# Patient Record
Sex: Male | Born: 1935
Health system: Southern US, Community
[De-identification: ages and names within clinical notes are randomized; demographics above are authoritative.]

## PROBLEM LIST (undated history)

## (undated) DIAGNOSIS — M109 Gout, unspecified: Secondary | ICD-10-CM

## (undated) DIAGNOSIS — N400 Enlarged prostate without lower urinary tract symptoms: Secondary | ICD-10-CM

## (undated) DIAGNOSIS — C801 Malignant (primary) neoplasm, unspecified: Secondary | ICD-10-CM

## (undated) DIAGNOSIS — Z8619 Personal history of other infectious and parasitic diseases: Secondary | ICD-10-CM

## (undated) DIAGNOSIS — I10 Essential (primary) hypertension: Secondary | ICD-10-CM

## (undated) DIAGNOSIS — E785 Hyperlipidemia, unspecified: Secondary | ICD-10-CM

## (undated) HISTORY — PX: LEG / ANKLE SOFT TISSUE BIOPSY: SUR148

## (undated) HISTORY — DX: Personal history of other infectious and parasitic diseases: Z86.19

## (undated) HISTORY — DX: Hyperlipidemia, unspecified: E78.5

---

## 1998-03-29 DIAGNOSIS — M109 Gout, unspecified: Secondary | ICD-10-CM | POA: Insufficient documentation

## 2001-03-29 DIAGNOSIS — C801 Malignant (primary) neoplasm, unspecified: Secondary | ICD-10-CM

## 2001-03-29 DIAGNOSIS — Z8546 Personal history of malignant neoplasm of prostate: Secondary | ICD-10-CM | POA: Insufficient documentation

## 2001-03-29 HISTORY — DX: Malignant (primary) neoplasm, unspecified: C80.1

## 2001-08-07 DIAGNOSIS — K5730 Diverticulosis of large intestine without perforation or abscess without bleeding: Secondary | ICD-10-CM | POA: Insufficient documentation

## 2002-02-26 HISTORY — PX: PROSTATE SURGERY: SHX751

## 2004-07-22 ENCOUNTER — Ambulatory Visit: Payer: Self-pay | Admitting: Radiation Oncology

## 2005-07-22 ENCOUNTER — Ambulatory Visit: Payer: Self-pay | Admitting: Radiation Oncology

## 2006-01-07 DIAGNOSIS — I10 Essential (primary) hypertension: Secondary | ICD-10-CM | POA: Insufficient documentation

## 2007-04-11 ENCOUNTER — Ambulatory Visit: Payer: Self-pay | Admitting: Gastroenterology

## 2007-04-11 HISTORY — PX: COLONOSCOPY: SHX174

## 2007-06-21 DIAGNOSIS — E881 Lipodystrophy, not elsewhere classified: Secondary | ICD-10-CM | POA: Insufficient documentation

## 2009-01-20 DIAGNOSIS — R7303 Prediabetes: Secondary | ICD-10-CM | POA: Insufficient documentation

## 2010-04-23 ENCOUNTER — Ambulatory Visit: Payer: Self-pay | Admitting: Orthopedic Surgery

## 2010-05-03 HISTORY — PX: SHOULDER ARTHROSCOPY W/ ROTATOR CUFF REPAIR: SHX2400

## 2010-05-22 ENCOUNTER — Ambulatory Visit: Payer: Self-pay | Admitting: Orthopedic Surgery

## 2010-06-01 ENCOUNTER — Ambulatory Visit: Payer: Self-pay | Admitting: Orthopedic Surgery

## 2010-06-07 ENCOUNTER — Observation Stay: Payer: Self-pay | Admitting: Internal Medicine

## 2011-07-26 IMAGING — CT CT CHEST W/ CM
1 series · 15 of 33 positions shown, 19 images · IV contrast (APPLIED)
Comparison: none

REASON FOR EXAM: dypsnea, post op
COMMENTS:

[Series 4: soft tissue · axial · 0.74mm/px · z∈[-340,-82]mm · 15 of 102 slices shown, 19 images]
[im 8/102  mediastinal]
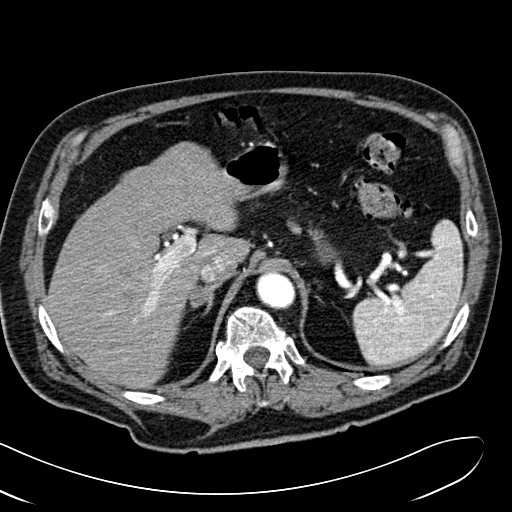
[im 8/102  lung]
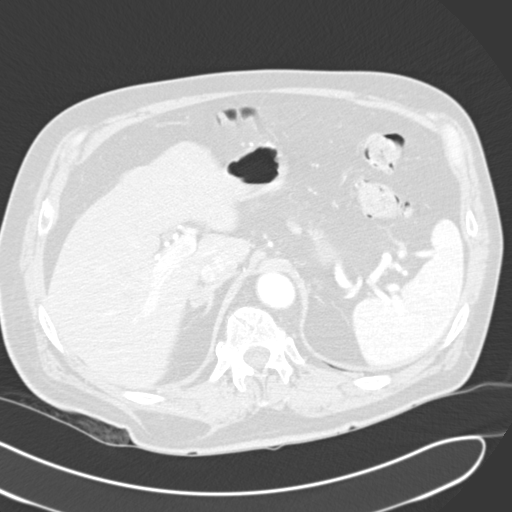
[im 15/102  lung]
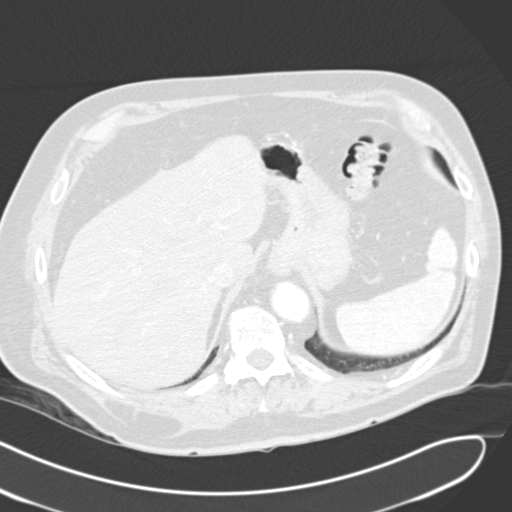
[im 21/102  lung]
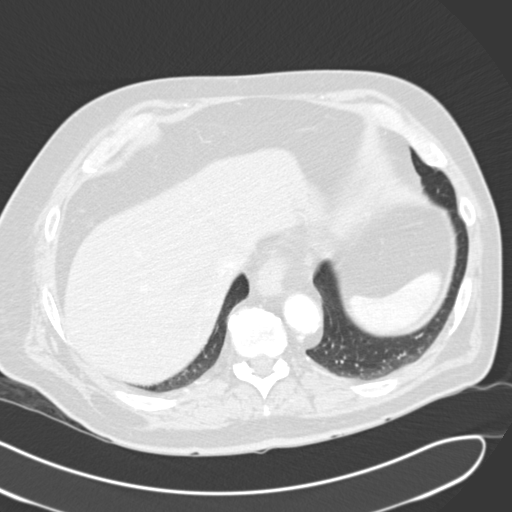
[im 27/102  lung]
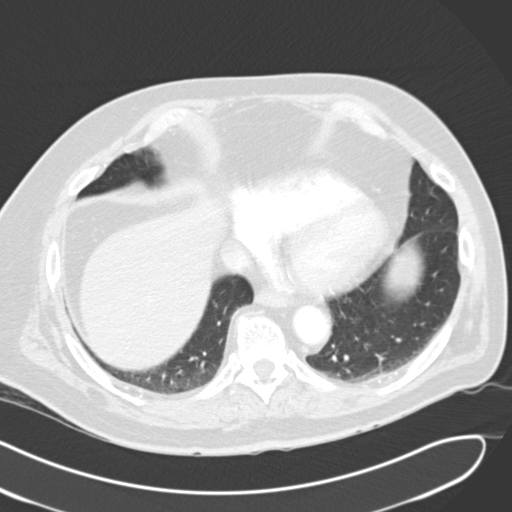
[im 34/102  mediastinal]
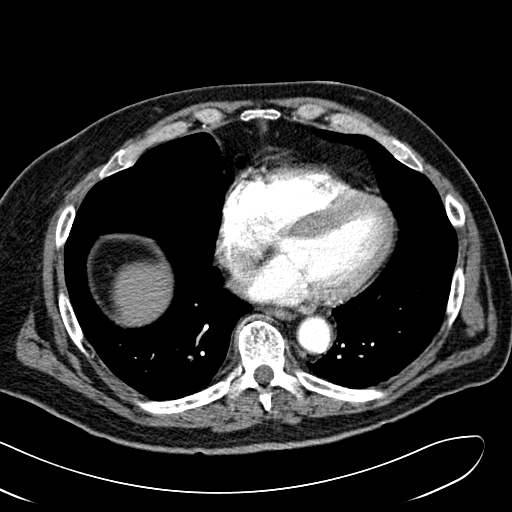
[im 34/102  lung]
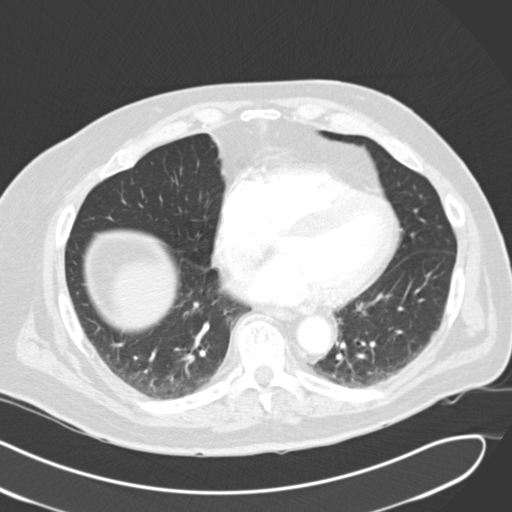
[im 41/102  lung]
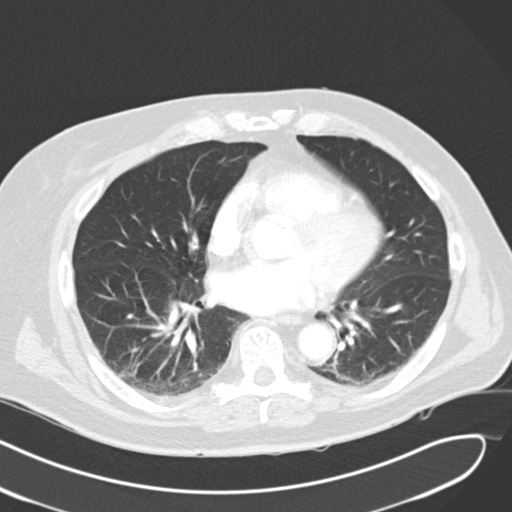
[im 45/102  lung]
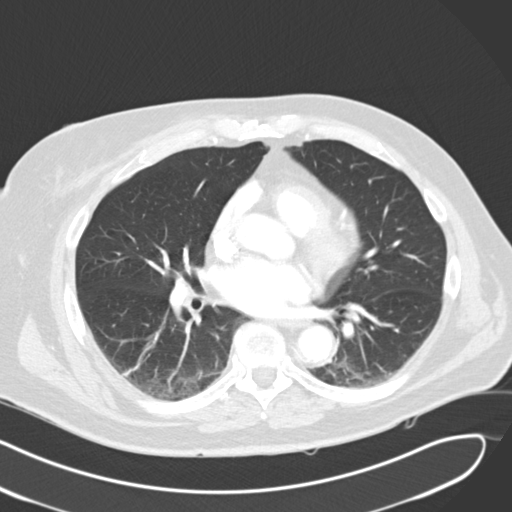
[im 53/102  lung]
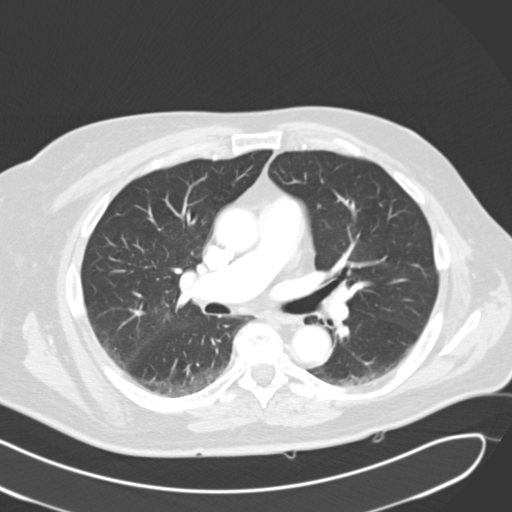
[im 57/102  mediastinal]
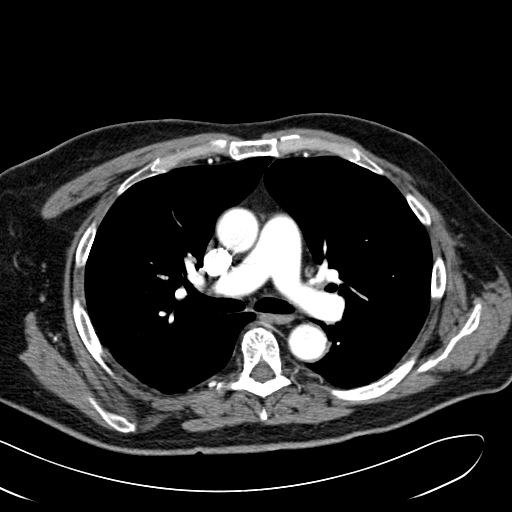
[im 57/102  lung]
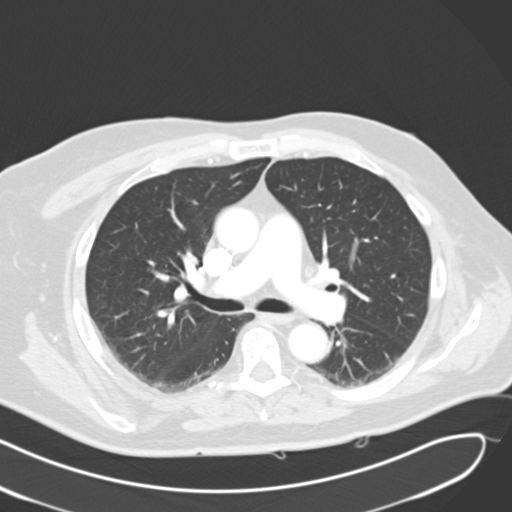
[im 61/102  lung]
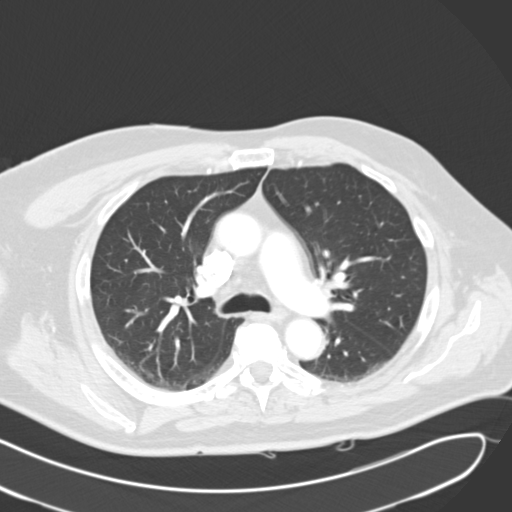
[im 68/102  lung]
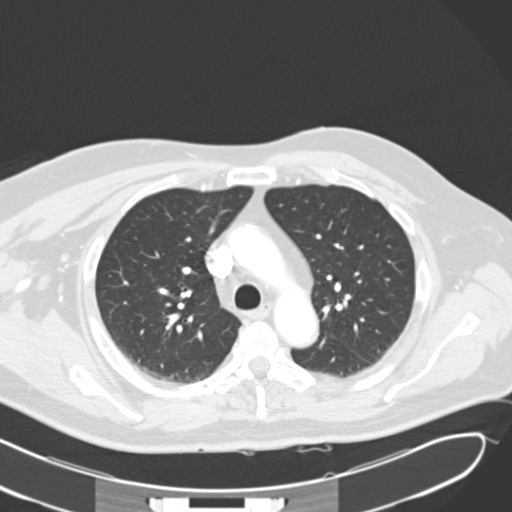
[im 75/102  lung]
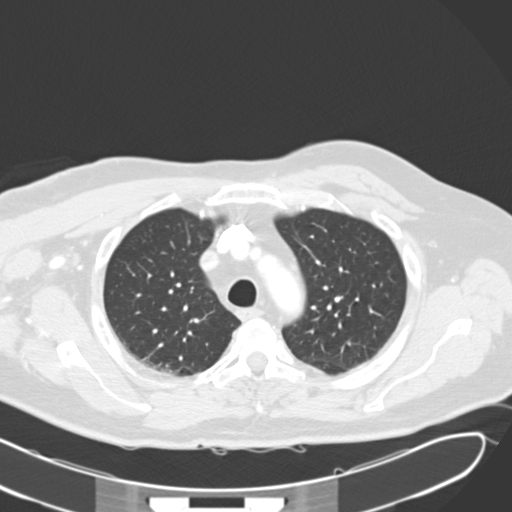
[im 81/102  mediastinal]
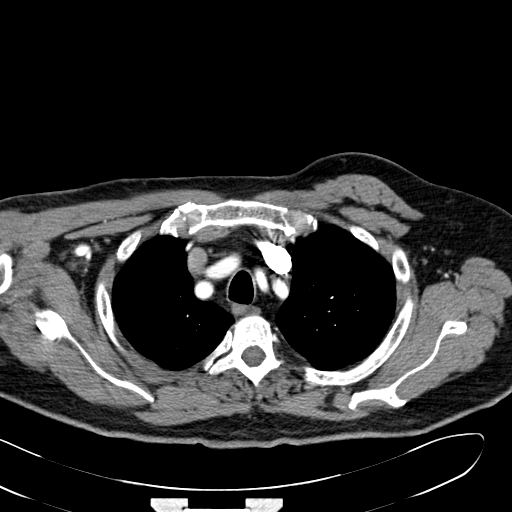
[im 81/102  lung]
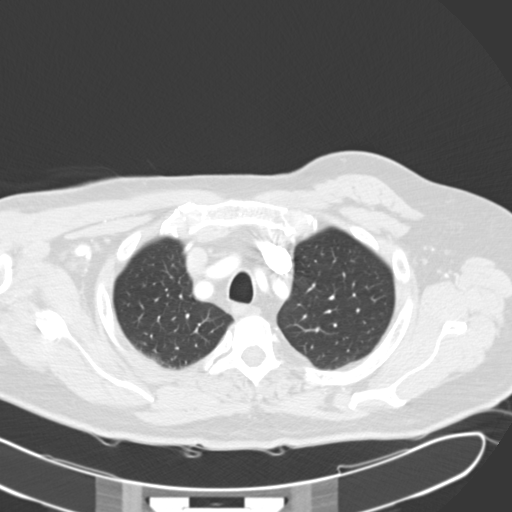
[im 87/102  lung]
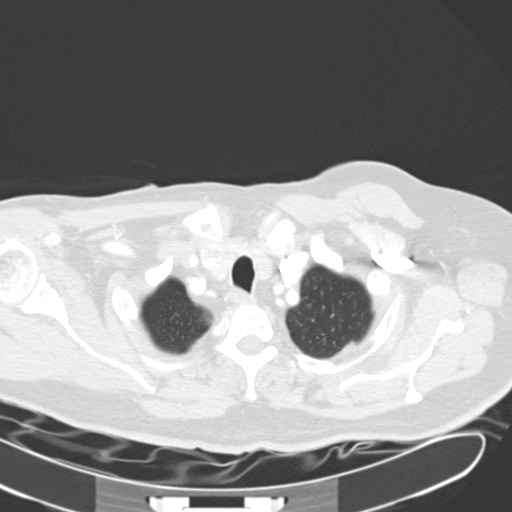
[im 94/102  lung]
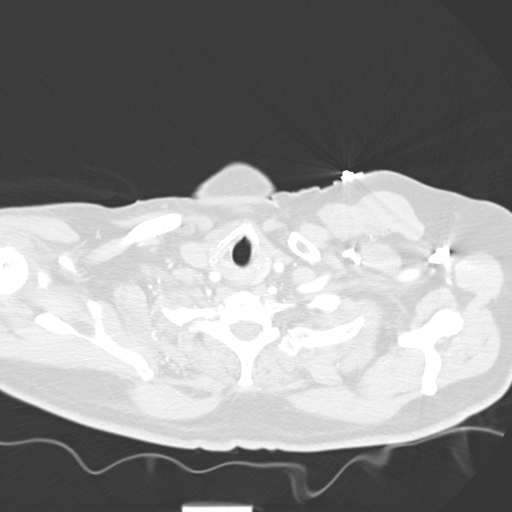

[15 of 33 positions shown; findings below may reference images not displayed]

PROCEDURE:     CT  - CT CHEST (FOR PE) W  - June 07, 2010  [DATE]

RESULT:     Axial CT scanning was performed through the chest at 3 mm
intervals and slice thicknesses following intravenous administration of 100
cc of [id number]. Review of multiplanar reconstructed images was performed
separately on the VIA monitor. Comparison is made to the chest x-ray 07 June, 2010.

The cardiac chambers are normal in size. The caliber of the thoracic aorta
is normal. There is no evidence of a false lumen. Contrast within the
pulmonary arterial tree is normal in appearance. I see no filling defects to
suggest an acute pulmonary embolism. I see no pathologic sized mediastinal
or hilar lymph nodes. There is no pleural nor pericardial effusion. The
retrosternal soft tissues are normal in appearance.

At lung window settings there is minimal compressive atelectasis posteriorly
in both lungs. Within the upper abdomen the observed portions of the liver
and spleen and gallbladder appear normal. There is mild enlargement of the
right adrenal gland. The lateral limb measures approximately 1.7 cm in
diameter.
IMPRESSION: 1. I do not see evidence of an acute pulmonary embolism.
2. I do not see evidence of CHF nor acute thoracic aortic pathology.
3. I see no acute pulmonary parenchymal abnormality.
4. There is mild enlargement of the right adrenal gland.

## 2011-09-16 IMAGING — CR DG CHEST 2V
1 series · 2 of 2 positions shown · non-contrast
Comparison: none

REASON FOR EXAM: near syncope
COMMENTS:   May transport without cardiac monitor

[Series 1: view not recorded · 0.17mm/px · 2 of 2 slices shown]
[im 1/2]
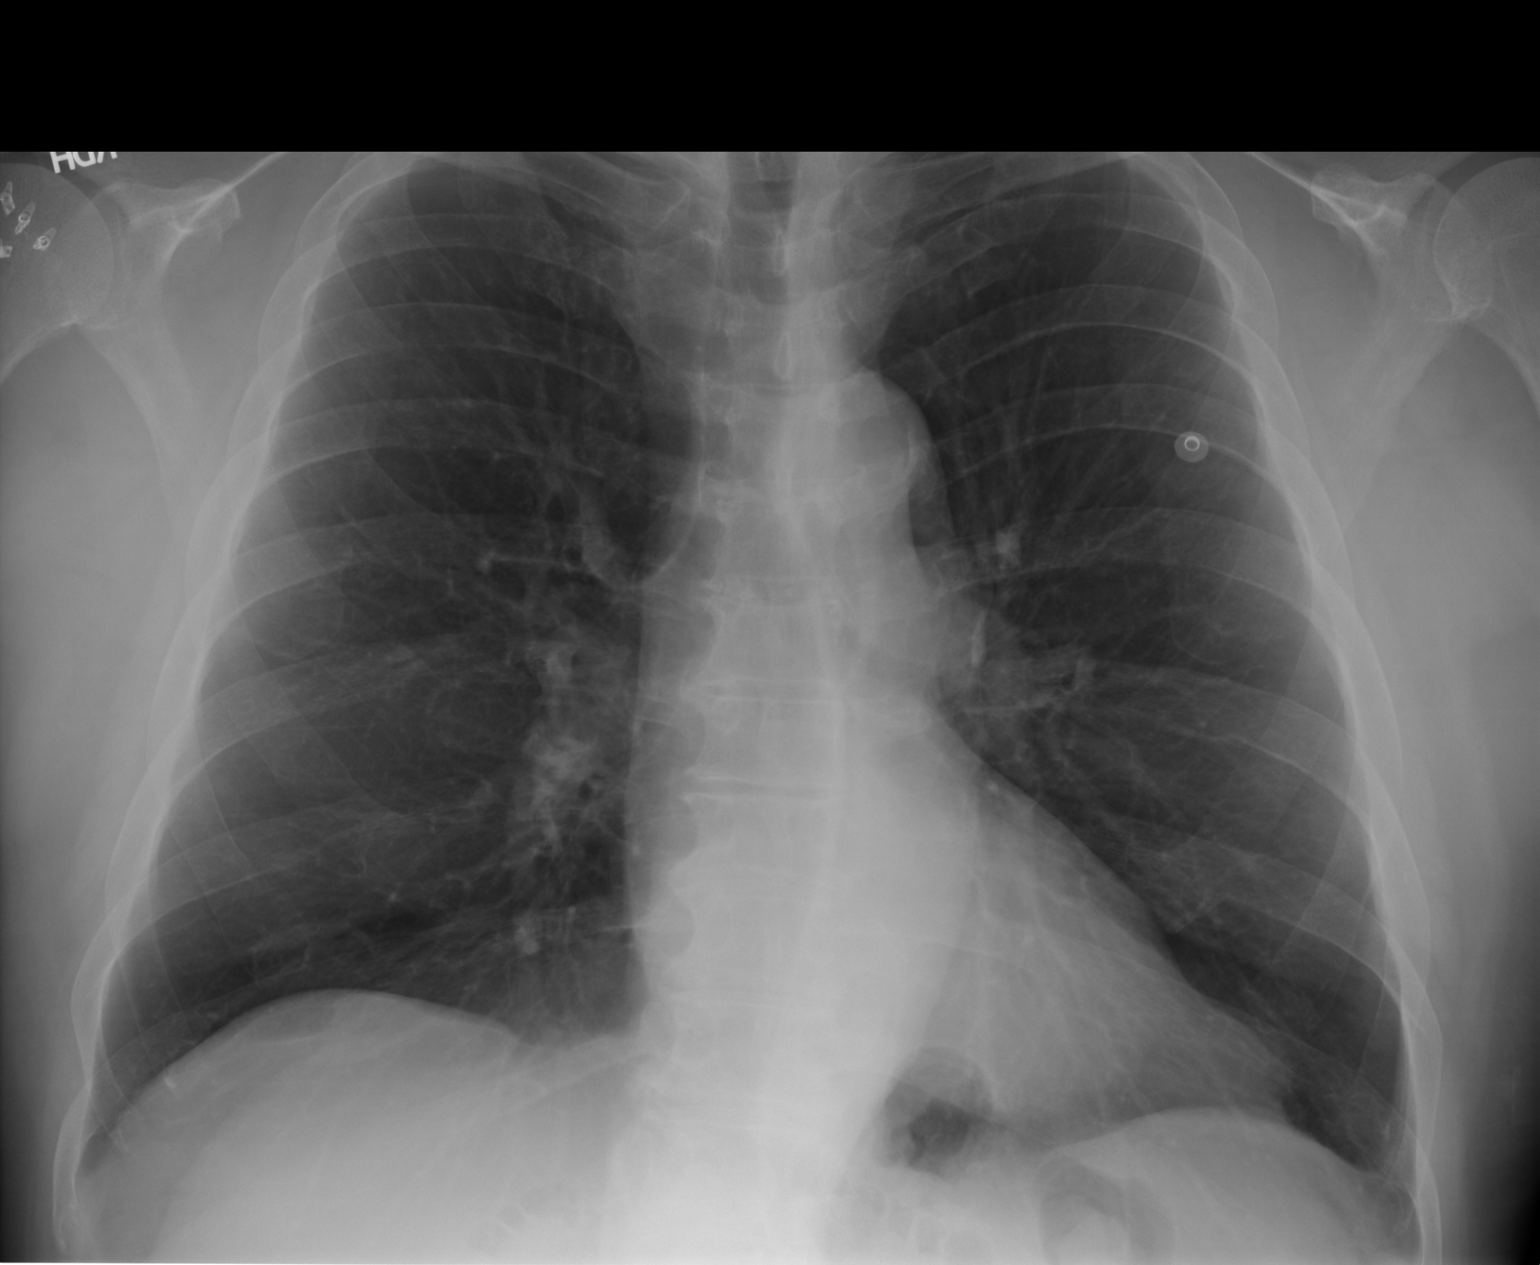
[im 2/2]
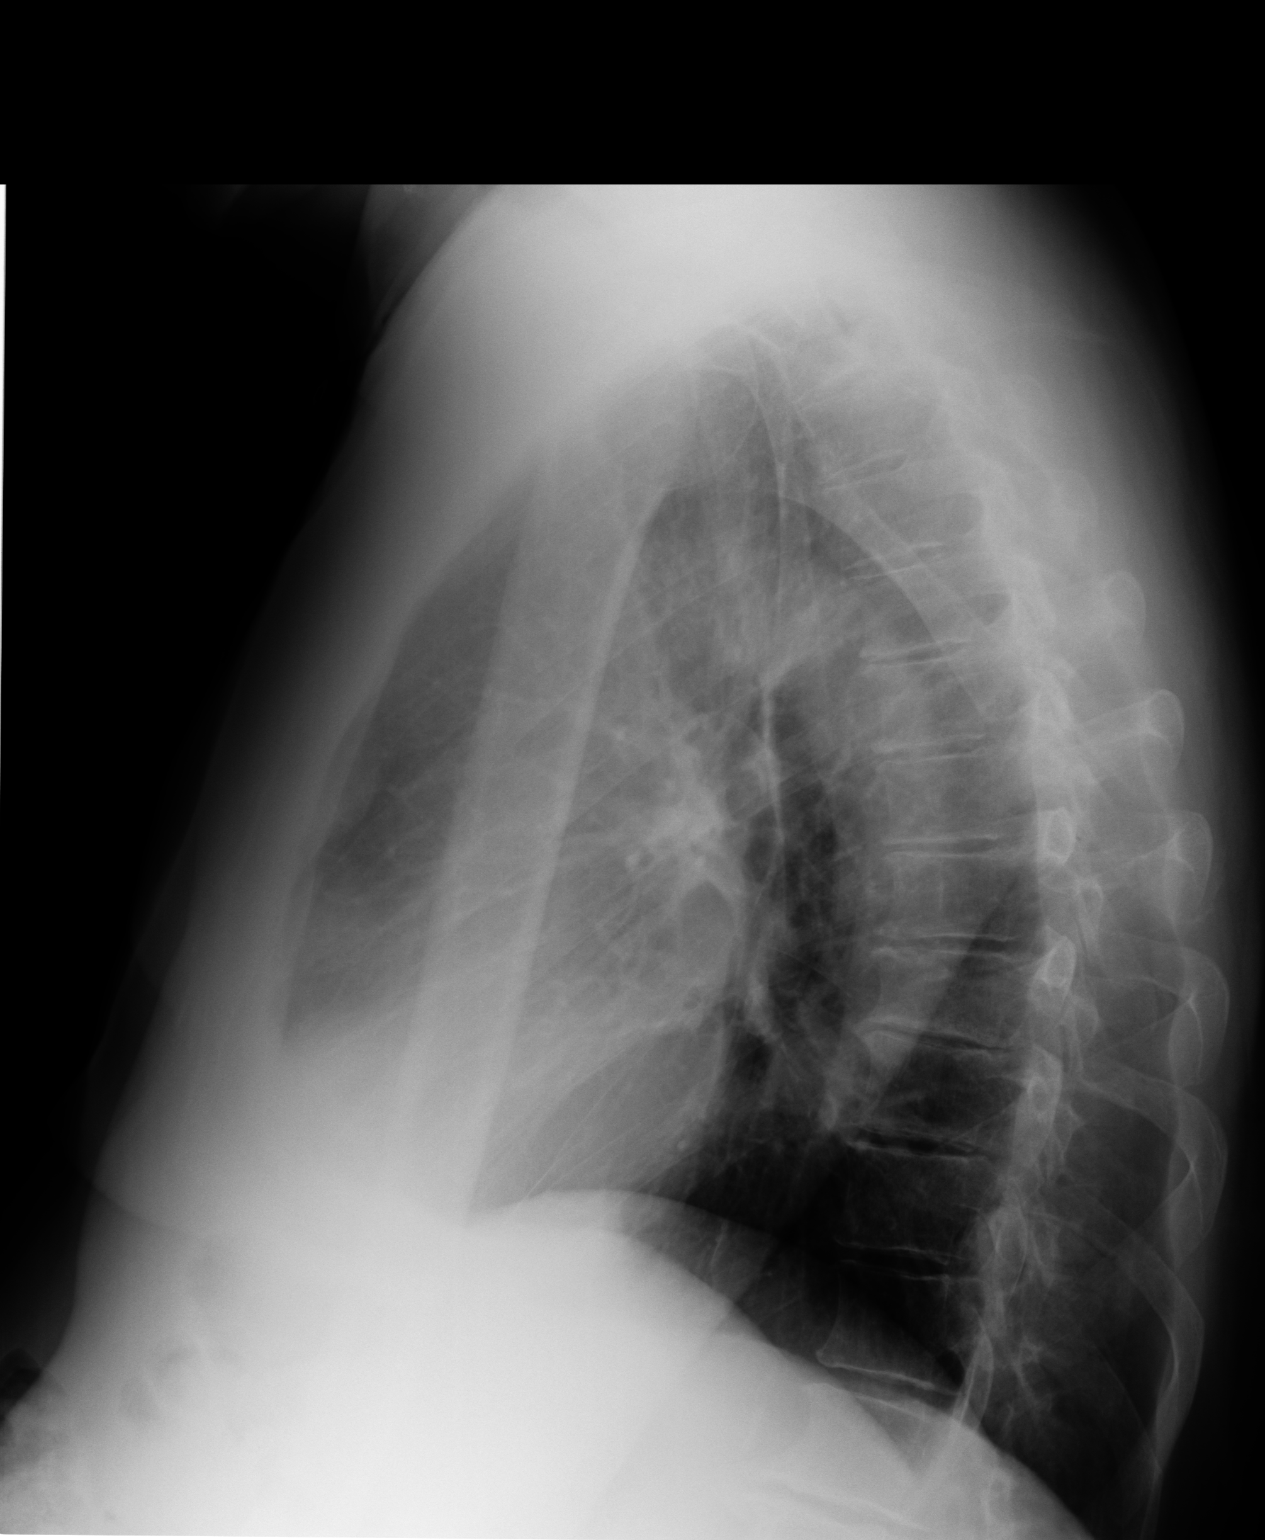

[2 of 2 positions shown; findings below may reference images not displayed]

PROCEDURE:     DXR - DXR CHEST PA (OR AP) AND LATERAL  - June 07, 2010  [DATE]

RESULT:     AP and lateral views of the chest reveal the lungs to be
well-expanded. I see no focal infiltrate. The cardiac silhouette is normal
in size. There is tortuosity of the descending thoracic aorta. The pulmonary
vascularity is not engorged. I see no pleural effusion. The bony thorax is
grossly normal.
IMPRESSION: I do not see evidence of acute cardiopulmonary abnormality.

## 2012-07-25 ENCOUNTER — Ambulatory Visit: Payer: Self-pay | Admitting: Gastroenterology

## 2012-07-26 LAB — PATHOLOGY REPORT

## 2014-04-26 LAB — PSA: PSA: 0.2

## 2014-04-26 LAB — BASIC METABOLIC PANEL
BUN: 13 mg/dL (ref 4–21)
CREATININE: 1 mg/dL (ref 0.6–1.3)
Glucose: 97 mg/dL
POTASSIUM: 4.6 mmol/L (ref 3.4–5.3)
SODIUM: 141 mmol/L (ref 137–147)

## 2014-12-09 ENCOUNTER — Encounter: Payer: Self-pay | Admitting: *Deleted

## 2014-12-16 ENCOUNTER — Ambulatory Visit: Payer: Medicare Other | Admitting: Anesthesiology

## 2014-12-16 ENCOUNTER — Ambulatory Visit
Admission: RE | Admit: 2014-12-16 | Discharge: 2014-12-16 | Disposition: A | Discharge status: Home / Self Care | Payer: Medicare Other | Source: Ambulatory Visit | Attending: Ophthalmology | Admitting: Ophthalmology

## 2014-12-16 ENCOUNTER — Encounter: Payer: Self-pay | Admitting: *Deleted

## 2014-12-16 ENCOUNTER — Encounter
Admission: RE | Disposition: A | Discharge status: Home / Self Care | Payer: Self-pay | Source: Ambulatory Visit | Attending: Ophthalmology

## 2014-12-16 DIAGNOSIS — N400 Enlarged prostate without lower urinary tract symptoms: Secondary | ICD-10-CM | POA: Diagnosis not present

## 2014-12-16 DIAGNOSIS — Z79899 Other long term (current) drug therapy: Secondary | ICD-10-CM | POA: Diagnosis not present

## 2014-12-16 DIAGNOSIS — Z7982 Long term (current) use of aspirin: Secondary | ICD-10-CM | POA: Insufficient documentation

## 2014-12-16 DIAGNOSIS — H269 Unspecified cataract: Secondary | ICD-10-CM | POA: Insufficient documentation

## 2014-12-16 DIAGNOSIS — I10 Essential (primary) hypertension: Secondary | ICD-10-CM | POA: Diagnosis not present

## 2014-12-16 DIAGNOSIS — E780 Pure hypercholesterolemia: Secondary | ICD-10-CM | POA: Diagnosis not present

## 2014-12-16 DIAGNOSIS — H2511 Age-related nuclear cataract, right eye: Secondary | ICD-10-CM | POA: Diagnosis not present

## 2014-12-16 DIAGNOSIS — Z8546 Personal history of malignant neoplasm of prostate: Secondary | ICD-10-CM | POA: Insufficient documentation

## 2014-12-16 HISTORY — DX: Benign prostatic hyperplasia without lower urinary tract symptoms: N40.0

## 2014-12-16 HISTORY — DX: Malignant (primary) neoplasm, unspecified: C80.1

## 2014-12-16 HISTORY — PX: CATARACT EXTRACTION W/PHACO: SHX586

## 2014-12-16 HISTORY — DX: Gout, unspecified: M10.9

## 2014-12-16 HISTORY — DX: Essential (primary) hypertension: I10

## 2014-12-16 SURGERY — PHACOEMULSIFICATION, CATARACT, WITH IOL INSERTION
Anesthesia: Monitor Anesthesia Care | Laterality: Right

## 2014-12-16 MED ORDER — ALFENTANIL 500 MCG/ML IJ INJ
INJECTION | INTRAMUSCULAR | Status: DC | PRN
  Administered 2014-12-16: 500 ug via INTRAVENOUS

## 2014-12-16 MED ORDER — BUPIVACAINE HCL (PF) 0.75 % IJ SOLN
INTRAMUSCULAR | Status: AC
  Filled 2014-12-16: qty 10

## 2014-12-16 MED ORDER — LIDOCAINE HCL (PF) 4 % IJ SOLN
INTRAMUSCULAR | Status: AC
  Filled 2014-12-16: qty 5

## 2014-12-16 MED ORDER — CEFUROXIME OPHTHALMIC INJECTION 1 MG/0.1 ML
INJECTION | OPHTHALMIC | Status: DC | PRN
  Administered 2014-12-16: 0.1 mL via INTRACAMERAL

## 2014-12-16 MED ORDER — TETRACAINE HCL 0.5 % OP SOLN
OPHTHALMIC | Status: AC
  Filled 2014-12-16: qty 2

## 2014-12-16 MED ORDER — MOXIFLOXACIN HCL 0.5 % OP SOLN
OPHTHALMIC | Status: AC
  Administered 2014-12-16: 1 [drp] via OPHTHALMIC
  Filled 2014-12-16: qty 3

## 2014-12-16 MED ORDER — EPINEPHRINE HCL 1 MG/ML IJ SOLN
INTRAMUSCULAR | Status: AC
  Filled 2014-12-16: qty 2

## 2014-12-16 MED ORDER — EPINEPHRINE HCL 1 MG/ML IJ SOLN
INTRAOCULAR | Status: DC | PRN
  Administered 2014-12-16: 250 mL via OPHTHALMIC

## 2014-12-16 MED ORDER — CYCLOPENTOLATE HCL 2 % OP SOLN
1.0000 [drp] | OPHTHALMIC | Status: AC | PRN
  Administered 2014-12-16 (×4): 1 [drp] via OPHTHALMIC

## 2014-12-16 MED ORDER — LIDOCAINE HCL (PF) 4 % IJ SOLN
INTRAMUSCULAR | Status: DC | PRN
  Administered 2014-12-16: 5 mL via OPHTHALMIC

## 2014-12-16 MED ORDER — CYCLOPENTOLATE HCL 2 % OP SOLN
OPHTHALMIC | Status: AC
  Administered 2014-12-16: 1 [drp] via OPHTHALMIC
  Filled 2014-12-16: qty 2

## 2014-12-16 MED ORDER — MIDAZOLAM HCL 2 MG/2ML IJ SOLN
INTRAMUSCULAR | Status: DC | PRN
  Administered 2014-12-16: 0.5 mg via INTRAVENOUS

## 2014-12-16 MED ORDER — CEFUROXIME OPHTHALMIC INJECTION 1 MG/0.1 ML
INJECTION | OPHTHALMIC | Status: AC
  Filled 2014-12-16: qty 0.1

## 2014-12-16 MED ORDER — SODIUM CHLORIDE 0.9 % IV SOLN
INTRAVENOUS | Status: DC
  Administered 2014-12-16: 08:00:00 via INTRAVENOUS
  Administered 2014-12-16: 50 mL/h via INTRAVENOUS

## 2014-12-16 MED ORDER — BSS IO SOLN
INTRAOCULAR | Status: DC | PRN
  Administered 2014-12-16: .5 mL via OPHTHALMIC

## 2014-12-16 MED ORDER — TETRACAINE HCL 0.5 % OP SOLN
OPHTHALMIC | Status: DC | PRN
  Administered 2014-12-16: 2 [drp] via OPHTHALMIC

## 2014-12-16 MED ORDER — MOXIFLOXACIN HCL 0.5 % OP SOLN
1.0000 [drp] | OPHTHALMIC | Status: AC | PRN
  Administered 2014-12-16 (×3): 1 [drp] via OPHTHALMIC

## 2014-12-16 MED ORDER — NA CHONDROIT SULF-NA HYALURON 40-17 MG/ML IO SOLN
INTRAOCULAR | Status: AC
  Filled 2014-12-16: qty 1

## 2014-12-16 MED ORDER — CARBACHOL 0.01 % IO SOLN
INTRAOCULAR | Status: DC | PRN
  Administered 2014-12-16: .1 mL via INTRAOCULAR

## 2014-12-16 MED ORDER — ATROPINE SULFATE 1 % OP SOLN
OPHTHALMIC | Status: AC
  Filled 2014-12-16: qty 5

## 2014-12-16 MED ORDER — PHENYLEPHRINE HCL 10 % OP SOLN
OPHTHALMIC | Status: AC
  Administered 2014-12-16: 1 [drp] via OPHTHALMIC
  Filled 2014-12-16: qty 5

## 2014-12-16 MED ORDER — HYALURONIDASE HUMAN 150 UNIT/ML IJ SOLN
INTRAMUSCULAR | Status: AC
  Filled 2014-12-16: qty 1

## 2014-12-16 MED ORDER — NA CHONDROIT SULF-NA HYALURON 40-17 MG/ML IO SOLN
INTRAOCULAR | Status: DC | PRN
  Administered 2014-12-16: 1 mL via INTRAOCULAR

## 2014-12-16 MED ORDER — PHENYLEPHRINE HCL 10 % OP SOLN
1.0000 [drp] | OPHTHALMIC | Status: AC | PRN
  Administered 2014-12-16 (×4): 1 [drp] via OPHTHALMIC

## 2014-12-16 SURGICAL SUPPLY — 33 items
CANNULA ANT/CHMB 27GA (MISCELLANEOUS) ×2 IMPLANT
CORD BIP STRL DISP 12FT (MISCELLANEOUS) ×2 IMPLANT
CUP MEDICINE 2OZ PLAST GRAD ST (MISCELLANEOUS) ×2 IMPLANT
DRAPE XRAY CASSETTE 23X24 (DRAPES) ×2 IMPLANT
ERASER HMR WETFIELD 18G (MISCELLANEOUS) ×2 IMPLANT
GLOVE BIO SURGEON STRL SZ8 (GLOVE) ×2 IMPLANT
GLOVE SURG LX 6.5 MICRO (GLOVE) ×1
GLOVE SURG LX 8.0 MICRO (GLOVE) ×1
GLOVE SURG LX STRL 6.5 MICRO (GLOVE) ×1 IMPLANT
GLOVE SURG LX STRL 8.0 MICRO (GLOVE) ×1 IMPLANT
GOWN STRL REUS W/ TWL LRG LVL3 (GOWN DISPOSABLE) ×1 IMPLANT
GOWN STRL REUS W/ TWL XL LVL3 (GOWN DISPOSABLE) ×1 IMPLANT
GOWN STRL REUS W/TWL LRG LVL3 (GOWN DISPOSABLE) ×1
GOWN STRL REUS W/TWL XL LVL3 (GOWN DISPOSABLE) ×1
LENS IOL ACRSF IQ ULTRA 15.0 (Intraocular Lens) ×1 IMPLANT
LENS IOL ACRYSOF IQ 15.0 (Intraocular Lens) ×2 IMPLANT
PACK CATARACT (MISCELLANEOUS) ×2 IMPLANT
PACK CATARACT DINGLEDEIN LX (MISCELLANEOUS) ×2 IMPLANT
PACK EYE AFTER SURG (MISCELLANEOUS) ×2 IMPLANT
SHLD EYE VISITEC  UNIV (MISCELLANEOUS) ×2 IMPLANT
SIZER BREAST SGL USE 650CC (SIZER) IMPLANT
SOL BAL SALT 15ML (MISCELLANEOUS) ×2
SOL BSS BAG (MISCELLANEOUS) ×2
SOL PREP PVP 2OZ (MISCELLANEOUS) ×2
SOLUTION BAL SALT 15ML (MISCELLANEOUS) ×1 IMPLANT
SOLUTION BSS BAG (MISCELLANEOUS) ×1 IMPLANT
SOLUTION PREP PVP 2OZ (MISCELLANEOUS) ×1 IMPLANT
SUT SILK 5-0 (SUTURE) ×2 IMPLANT
SYR 3ML LL SCALE MARK (SYRINGE) ×2 IMPLANT
SYR 5ML LL (SYRINGE) ×4 IMPLANT
SYR TB 1ML 27GX1/2 LL (SYRINGE) ×2 IMPLANT
WATER STERILE IRR 1000ML POUR (IV SOLUTION) ×2 IMPLANT
WIPE NON LINTING 3.25X3.25 (MISCELLANEOUS) ×2 IMPLANT

## 2014-12-16 NOTE — Interval H&P Note (Signed)
History and Physical Interval Note:  12/16/2014 7:22 AM  Mark Hunter  has presented today for surgery, with the diagnosis of CATARACT  The various methods of treatment have been discussed with the patient and family. After consideration of risks, benefits and other options for treatment, the patient has consented to  Procedure(s): CATARACT EXTRACTION PHACO AND INTRAOCULAR LENS PLACEMENT ([place]) (Right) as a surgical intervention .  The patient's history has been reviewed, patient examined, no change in status, stable for surgery.  I have reviewed the patient's chart and labs.  Questions were answered to the patient's satisfaction.     DINGELDEIN,STEVEN

## 2014-12-16 NOTE — Op Note (Signed)
Date of Surgery: 12/16/2014 Date of Dictation: 12/16/2014 8:11 AM Pre-operative Diagnosis:  Nuclear Sclerotic Cataract and Cortical Cataract right Eye Post-operative Diagnosis: same Procedure performed: Extra-capsular Cataract Extraction (ECCE) with placement of a posterior chamber intraocular lens (IOL) right Eye IOL:  Implant Name Type Inv. Item Serial No. Manufacturer Lot No. LRB No. Used  LENS IOL ACRYSOF IQ 15.0 - [id number] Intraocular Lens LENS IOL ACRYSOF IQ 15.0 [id number] ALCON   Right 1   Anesthesia: 2% Lidocaine and 4% Marcaine in a 50/50 mixture with 10 unites/ml of Hylenex given as a peribulbar Anesthesiologist: Anesthesiologist: Gunnar Fusi, MD CRNA: Jonna Clark, CRNA Complications: none Estimated Blood Loss: less than 1 ml  Description of procedure:  The patient was given anesthesia and sedation via intravenous access. The patient was then prepped and draped in the usual fashion. A 25-gauge needle was bent for initiating the capsulorhexis. A 5-0 silk suture was placed through the conjunctiva superior and inferiorly to serve as bridle sutures. Hemostasis was obtained at the superior limbus using an eraser cautery. A partial thickness groove was made at the anterior surgical limbus with a 64 Beaver blade and this was dissected anteriorly with an Avaya. The anterior chamber was entered at 10 o'clock with a 1.0 mm paracentesis knife and through the lamellar dissection with a 2.6 mm Alcon keratome. Epi-Shugarcaine 0.5 CC [9 cc BSS Plus (Alcon), 3 cc 4% preservative-free lidocaine (Hospira) and 4 cc 1:1000 preservative-free, bisulfite-free epinephrine] was injected into the anterior chamber via the paracentesis tract. Epi-Shugarcaine 0.5 CC [9 cc BSS Plus (Alcon), 3 cc 4% preservative-free lidocaine (Hospira) and 4 cc 1:1000 preservative-free, bisulfite-free epinephrine] was injected into the anterior chamber via the paracentesis tract. DiscoVisc was injected to  replace the aqueous and a continuous tear curvilinear capsulorhexis was performed using a bent 25-gauge needle.  Balance salt on a syringe was used to perform hydro-dissection and phacoemulsification was carried out using a divide and conquer technique. Procedure(s) with comments: CATARACT EXTRACTION PHACO AND INTRAOCULAR LENS PLACEMENT ([facility]) (Right) - [place]   1:12 ap    20.2 cde   25.43 cassette lot # [id number]. Irrigation/aspiration was used to remove the residual cortex. All the cortex came out with the phacoemulsification so none was left after the angles of the bag were checked. The capsular bag was inflated with DiscoVisc. The intraocular lens was inserted into the capsular bag using a pre-loaded UltraSert Delivery System. Irrigation/aspiration was used to remove the residual DiscoVisc. The wound was inflated with balanced salt and checked for leaks. None were found. Miostat was injected via the paracentesis track and 0.1 ml of cefuroxime containing 1 mg of drug  was injected via the paracentesis track. The wound was checked for leaks again and none were found.   The bridal sutures were removed and two drops of Vigamox were placed on the eye. An eye shield was placed to protect the eye and the patient was discharged to the recovery area in good condition.   DINGELDEIN,STEVEN MD

## 2014-12-16 NOTE — Transfer of Care (Signed)
Immediate Anesthesia Transfer of Care Note  Patient: Mark Hunter  Procedure(s) Performed: Procedure(s) with comments: CATARACT EXTRACTION PHACO AND INTRAOCULAR LENS PLACEMENT ([facility]) (Right) - [place]   1:12 ap    20.2 cde   25.43 cassette lot # [id number]  Patient Location: PACU  Anesthesia Type:MAC  Level of Consciousness: awake, alert  and oriented  Airway & Oxygen Therapy: Patient Spontanous Breathing  Post-op Assessment: Report given to RN and Post -op Vital signs reviewed and stable  Post vital signs: Reviewed and stable  Last Vitals:  Filed Vitals:   12/16/14 0813  BP: 147/55  Pulse: 50  Temp: 36.3 C  Resp: 16    Complications: No apparent anesthesia complications

## 2014-12-16 NOTE — Anesthesia Postprocedure Evaluation (Signed)
  Anesthesia Post-op Note  Patient: Mark Hunter  Procedure(s) Performed: Procedure(s) with comments: CATARACT EXTRACTION PHACO AND INTRAOCULAR LENS PLACEMENT ([facility]) (Right) - [place]   1:12 ap    20.2 cde   25.43 cassette lot # [id number]  Anesthesia type:MAC  Patient location: PACU  Post pain: Pain level controlled  Post assessment: Post-op Vital signs reviewed, Patient's Cardiovascular Status Stable, Respiratory Function Stable, Patent Airway and No signs of Nausea or vomiting  Post vital signs: Reviewed and stable  Last Vitals:  Filed Vitals:   12/16/14 0813  BP: 147/55  Pulse: 50  Temp: 36.3 C  Resp: 16    Level of consciousness: awake, alert  and patient cooperative  Complications: No apparent anesthesia complications

## 2014-12-16 NOTE — H&P (Signed)
See scanned note.

## 2014-12-16 NOTE — Addendum Note (Signed)
Addendum  created 12/16/14 7207 by Gunnar Fusi, MD   Modules edited: Anesthesia Attestations

## 2014-12-16 NOTE — Discharge Instructions (Addendum)
See handout. Eye Surgery Discharge Instructions  Expect mild scratchy sensation or mild soreness. DO NOT RUB YOUR EYE!  The day of surgery:  Minimal physical activity, but bed rest is not required  No reading, computer work, or close hand work  No bending, lifting, or straining.  May watch TV  For 24 hours:  No driving, legal decisions, or alcoholic beverages  Safety precautions  Eat anything you prefer: It is better to start with liquids, then soup then solid foods.  _____ Eye patch should be worn until postoperative exam tomorrow.  ____ Solar shield eyeglasses should be worn for comfort in the sunlight/patch while sleeping  Resume all regular medications including aspirin or Coumadin if these were discontinued prior to surgery. You may shower, bathe, shave, or wash your hair. Tylenol may be taken for mild discomfort.  Call your doctor if you experience significant pain, nausea, or vomiting, fever > 101 or other signs of infection. [phone] or [phone] Specific instructions:  Follow-up Information    Follow up with Estill Cotta, MD.   Specialty:  Ophthalmology   Why:  12-16-14 at 10:30   Contact information:   [place] [id number] [phone]      Eye Surgery Discharge Instructions  Expect mild scratchy sensation or mild soreness. DO NOT RUB YOUR EYE!  The day of surgery:  Minimal physical activity, but bed rest is not required  No reading, computer work, or close hand work  No bending, lifting, or straining.  May watch TV  For 24 hours:  No driving, legal decisions, or alcoholic beverages  Safety precautions  Eat anything you prefer: It is better to start with liquids, then soup then solid foods.  _____ Eye patch should be worn until postoperative exam tomorrow.  ____ Solar shield eyeglasses should be worn for comfort in the sunlight/patch while sleeping  Resume all regular medications including aspirin or  Coumadin if these were discontinued prior to surgery. You may shower, bathe, shave, or wash your hair. Tylenol may be taken for mild discomfort.  Call your doctor if you experience significant pain, nausea, or vomiting, fever > 101 or other signs of infection. [phone] or [phone] Specific instructions:  Follow-up Information    Follow up with Estill Cotta, MD.   Specialty:  Ophthalmology   Why:  12-16-14 at 10:30   Contact information:   [address]   [place] [address] [phone]

## 2014-12-16 NOTE — Anesthesia Preprocedure Evaluation (Addendum)
Anesthesia Evaluation  Patient identified by MRN, date of birth, ID band Patient awake    Reviewed: Allergy & Precautions, NPO status , Patient's Chart, lab work & pertinent test results  History of Anesthesia Complications Negative for: history of anesthetic complications  Airway Mallampati: II  TM Distance: >3 FB Neck ROM: Full    Dental  (+) Teeth Intact   Pulmonary neg pulmonary ROS,           Cardiovascular hypertension, Pt. on medications and Pt. on home beta blockers      Neuro/Psych negative neurological ROS     GI/Hepatic negative GI ROS, Neg liver ROS,   Endo/Other  negative endocrine ROS  Renal/GU negative Renal ROS     Musculoskeletal   Abdominal   Peds  Hematology negative hematology ROS (+)   Anesthesia Other Findings   Reproductive/Obstetrics                            Anesthesia Physical Anesthesia Plan  ASA: II  Anesthesia Plan: MAC   Post-op Pain Management:    Induction: Intravenous  Airway Management Planned: Nasal Cannula  Additional Equipment:   Intra-op Plan:   Post-operative Plan:   Informed Consent: I have reviewed the patients History and Physical, chart, labs and discussed the procedure including the risks, benefits and alternatives for the proposed anesthesia with the patient or authorized representative who has indicated his/her understanding and acceptance.     Plan Discussed with:   Anesthesia Plan Comments:         Anesthesia Quick Evaluation

## 2014-12-17 ENCOUNTER — Encounter: Payer: Self-pay | Admitting: Ophthalmology

## 2015-07-09 DIAGNOSIS — M545 Low back pain, unspecified: Secondary | ICD-10-CM | POA: Insufficient documentation

## 2015-07-09 DIAGNOSIS — M25519 Pain in unspecified shoulder: Secondary | ICD-10-CM | POA: Insufficient documentation

## 2015-07-16 ENCOUNTER — Encounter: Payer: Self-pay | Admitting: Family Medicine

## 2015-07-17 ENCOUNTER — Encounter: Payer: Self-pay | Admitting: Family Medicine

## 2015-07-17 ENCOUNTER — Ambulatory Visit (INDEPENDENT_AMBULATORY_CARE_PROVIDER_SITE_OTHER): Payer: Medicare HMO | Admitting: Family Medicine

## 2015-07-17 VITALS — BP 140/58 | HR 64 | Temp 98.5°F | Resp 16 | Ht 68.0 in | Wt 203.0 lb

## 2015-07-17 DIAGNOSIS — Z23 Encounter for immunization: Secondary | ICD-10-CM

## 2015-07-17 DIAGNOSIS — Z125 Encounter for screening for malignant neoplasm of prostate: Secondary | ICD-10-CM | POA: Diagnosis not present

## 2015-07-17 DIAGNOSIS — Z0000 Encounter for general adult medical examination without abnormal findings: Secondary | ICD-10-CM

## 2015-07-17 DIAGNOSIS — L989 Disorder of the skin and subcutaneous tissue, unspecified: Secondary | ICD-10-CM | POA: Diagnosis not present

## 2015-07-17 DIAGNOSIS — Z8546 Personal history of malignant neoplasm of prostate: Secondary | ICD-10-CM | POA: Diagnosis not present

## 2015-07-17 DIAGNOSIS — I10 Essential (primary) hypertension: Secondary | ICD-10-CM | POA: Diagnosis not present

## 2015-07-17 NOTE — Patient Instructions (Signed)
It is recommended to engage in 150 minutes of vigorous exercise every week.

## 2015-07-17 NOTE — Progress Notes (Signed)
Patient: Mark Hunter, Male    DOB: [birth year], 82 y.o.   MRN: [id number] Visit Date: 07/17/2015  Today's Provider: Lelon Huh, MD   Chief Complaint  Patient presents with  . Annual Exam  . Hypertension  . Gout   Subjective:    Annual physical  Mark Hunter is a 82 y.o. male. He feels well. He reports exercising yes/light walking. He reports he is sleeping fairly well.  -----------------------------------------------------------   Follow-up for gouty arthropathy from 04/23/2014; no changes were made, continue current medications.    Hypertension, follow-up:  BP Readings from Last 3 Encounters:  07/17/15 140/58  12/16/14 149/52    He was last seen for hypertension 04/23/2014.  BP at that visit was 140/56. Management since that visit includes; no changes were made, continue current medications.He reports good compliance with treatment. He is not having side effects. none  He is exercising. He is adherent to low salt diet.   Outside blood pressures are 140/60. He is experiencing none.  Patient denies none.   Cardiovascular risk factors include none.  Use of agents associated with hypertension: none.   ----------------------------------------------------------------------     Review of Systems  Constitutional: Negative for fever, chills, appetite change and fatigue.  HENT: Negative for congestion, ear pain, hearing loss, nosebleeds and trouble swallowing.   Eyes: Negative for pain and visual disturbance.  Respiratory: Negative for cough, chest tightness and shortness of breath.   Cardiovascular: Negative for chest pain, palpitations and leg swelling.  Gastrointestinal: Negative for nausea, vomiting, abdominal pain, diarrhea, constipation and blood in stool.  Endocrine: Negative for polydipsia, polyphagia and polyuria.  Genitourinary: Negative for dysuria and flank pain.  Musculoskeletal: Negative for myalgias, back pain, joint swelling,  arthralgias and neck stiffness.  Skin: Negative for color change, rash and wound.  Neurological: Negative for dizziness, tremors, seizures, speech difficulty, weakness, light-headedness and headaches.  Psychiatric/Behavioral: Negative for behavioral problems, confusion, sleep disturbance, dysphoric mood and decreased concentration. The patient is not nervous/anxious.   All other systems reviewed and are negative.   Social History   Social History  . Marital Status: Married    Spouse Name: N/A  . Number of Children: 3  . Years of Education: N/A   Occupational History  . Retired    Social History Main Topics  . Smoking status: Never Smoker   . Smokeless tobacco: Not on file  . Alcohol Use: Yes     Comment: one glass a day  . Drug Use: Not on file  . Sexual Activity: Not on file   Other Topics Concern  . Not on file   Social History Narrative    Past Medical History  Diagnosis Date  . Hypertension   . BPH (benign prostatic hyperplasia)   . Gout   . Cancer [facility]) 2003    prostate  . History of measles   . History of mumps      Patient Active Problem List   Diagnosis Date Noted  . Pain in shoulder 07/09/2015  . Low back pain 07/09/2015  . Abnormal blood sugar 01/20/2009  . Lipodystrophy 06/21/2007  . History of colon polyps 01/09/2006  . Essential (primary) hypertension 01/07/2006  . Diverticulosis of colon without hemorrhage 08/07/2001  . Personal history of prostate cancer 03/29/2001  . Gouty arthropathy 03/29/1998    Past Surgical History  Procedure Laterality Date  . Shoulder arthroscopy w/ rotator cuff repair Right 05/03/2010  . Cataract extraction w/phaco Right 12/16/2014  Procedure: CATARACT EXTRACTION PHACO AND INTRAOCULAR LENS PLACEMENT ([facility]);  Surgeon: Estill Cotta, MD;  Location: [facility] ORS;  Service: Ophthalmology;  Laterality: Right;  [place]   1:12 ap    20.2 cde   25.43 cassette lot # [id number]  . Prostate surgery  02/2002    seed implant  .  Colonoscopy screening  04/11/2007    Dr. Donnella Sham, [facility]. Hyperplastic polyps; radiation colitis, multiple colonic angioectasias    His family history includes Stroke in his father. There is no history of Diabetes or Heart disease.    Previous Medications   AMLODIPINE (NORVASC) 5 MG TABLET    Take 5 mg by mouth daily.   ASPIRIN 81 MG TABLET    Take 81 mg by mouth daily.   ATENOLOL (TENORMIN) 50 MG TABLET    Take 50 mg by mouth daily.   INDOMETHACIN (INDOCIN SR) 75 MG CR CAPSULE    Take 1 capsule by mouth 2 (two) times daily as needed.   MULTIPLE VITAMIN (MULTI-VITAMINS PO)    Take 1 tablet by mouth once.   OMEGA-3 FATTY ACIDS (FISH OIL PO)    Take 1 capsule by mouth once.   TAMSULOSIN (FLOMAX) 0.4 MG CAPS CAPSULE    Take 0.4 mg by mouth.    Patient Care Team: Birdie Sons, MD as PCP - General (Family Medicine) Abbie Sons, MD (Urology)     Objective:   Vitals: BP 140/58 mmHg  Pulse 64  Temp(Src) 98.5 F (36.9 C) (Oral)  Resp 16  Ht 5\' 8"  (1.727 m)  Wt 203 lb (92.08 kg)  BMI 30.87 kg/m2  SpO2 95%  Physical Exam   General Appearance:    Alert, cooperative, no distress, appears stated age  Head:    Normocephalic, without obvious abnormality, atraumatic  Eyes:    PERRL, conjunctiva/corneas clear, EOM's intact, fundi    benign, both eyes       Ears:    Normal TM's and external ear canals, both ears  Nose:   Nares normal, septum midline, mucosa normal, no drainage   or sinus tenderness  Throat:   Lips, mucosa, and tongue normal; teeth and gums normal  Neck:   Supple, symmetrical, trachea midline, no adenopathy;       thyroid:  No enlargement/tenderness/nodules; no carotid   bruit or JVD  Back:     Symmetric, no curvature, ROM normal, no CVA tenderness  Lungs:     Clear to auscultation bilaterally, respirations unlabored  Chest wall:    No tenderness or deformity. Large dark scaly exophytic lesion lower chest near midline.   Heart:    Regular rate and rhythm, S1 and S2  normal, no murmur, rub   or gallop  Abdomen:     Soft, non-tender, bowel sounds active all four quadrants,    no masses, no organomegaly  Genitalia:    deferred  Rectal:    deferred  Extremities:   Extremities normal, atraumatic, no cyanosis or edema  Pulses:   2+ and symmetric all extremities  Skin:   Skin color, texture, turgor normal, no rashes or lesions  Lymph nodes:   Cervical, supraclavicular, and axillary nodes normal  Neurologic:   CNII-XII intact. Normal strength, sensation and reflexes      throughout    Activities of Daily Living In your present state of health, do you have any difficulty performing the following activities: 07/17/2015  Hearing? N  Vision? N  Difficulty concentrating or making decisions? N  Walking or climbing  stairs? N  Dressing or bathing? N  Doing errands, shopping? N    Fall Risk Assessment Fall Risk  07/17/2015  Falls in the past year? No     Depression Screen PHQ 2/9 Scores 07/17/2015  PHQ - 2 Score 0  PHQ- 9 Score 0    Cognitive Testing - 6-CIT  Correct? Score   What year is it? yes 0 0 or 4  What month is it? yes 0 0 or 3  Memorize:    Pia Mau,  44,  [place],      What time is it? (within 1 hour) yes 0 0 or 3  Count backwards from 20 yes 0 0, 2, or 4  Name the months of the year yes 0 0, 2, or 4  Repeat name & address above yes 0 0, 2, 4, 6, 8, or 10       TOTAL SCORE  0/28   Interpretation:  Normal  Normal (0-7) Abnormal (8-28)    Audit-C Alcohol Use Screening  Question Answer Points  How often do you have alcoholic drink? 4 or more times/week 4  On days you do drink alcohol, how many drinks do you typically consume? 0 0  How oftey will you drink 6 or more in a total? never 0  Total Score:  4   A score of 3 or more in women, and 4 or more in men indicates increased risk for alcohol abuse, EXCEPT if all of the points are from question 1.     Assessment & Plan:    Annual Physical Reviewed patient's  Family Medical History Reviewed and updated list of patient's medical providers Assessment of cognitive impairment was done Assessed patient's functional ability Established a written schedule for health screening [place] Completed and Reviewed  Exercise Activities and Dietary recommendations Goals    None      Immunization History  Administered Date(s) Administered  . Pneumococcal Conjugate-13 04/23/2014  . Pneumococcal Polysaccharide-23 01/22/2004  . Zoster 04/13/2006    Health Maintenance  Topic Date Due  . Samul Dada  10/07/1950  . INFLUENZA VACCINE  10/28/2015  . ZOSTAVAX  Completed  . PNA vac Low Risk Adult  Completed      Discussed health benefits of physical activity, and encouraged him to engage in regular exercise appropriate for his age and condition.    -------------------------------------------------------------------------------- 1. Annual physical exam Generally doing well with no limits to ADLs.   2. Personal history of prostate cancer  - PSA  3. Essential (primary) hypertension Well controlled.  Continue current medications.   - EKG 12-Lead - Renal function panel  4. Skin lesion of chest wall Quite a bit larger than a typical SK, and no other similar lesions on trunk.  He states lesion has been there for many years, but usually falls off before it gets as big as it is now. He is interested in having dermatologist take a look.  - Ambulatory referral to Dermatology  5. Need for Tdap vaccination  - Tdap vaccine greater than or equal to 7yo IM

## 2015-07-18 ENCOUNTER — Other Ambulatory Visit: Payer: Self-pay | Admitting: Emergency Medicine

## 2015-07-18 MED ORDER — ATENOLOL 50 MG PO TABS: 50.0000 mg | ORAL_TABLET | Freq: Every day | ORAL | Status: DC

## 2015-07-18 MED ORDER — TAMSULOSIN HCL 0.4 MG PO CAPS: 0.4000 mg | ORAL_CAPSULE | Freq: Every day | ORAL | Status: DC

## 2015-07-18 MED ORDER — AMLODIPINE BESYLATE 5 MG PO TABS: 5.0000 mg | ORAL_TABLET | Freq: Every day | ORAL | Status: DC

## 2015-07-18 NOTE — Telephone Encounter (Signed)
Pt said there was suppose to be refills called in yesterday but the pharmacy says they do not have them. It was for amlodipine, Tamsulosin and atenolol. He would like these called into [facility] in [place]. Thanks

## 2015-07-21 DIAGNOSIS — I10 Essential (primary) hypertension: Secondary | ICD-10-CM | POA: Diagnosis not present

## 2015-07-21 DIAGNOSIS — Z8546 Personal history of malignant neoplasm of prostate: Secondary | ICD-10-CM | POA: Diagnosis not present

## 2015-07-21 DIAGNOSIS — Z125 Encounter for screening for malignant neoplasm of prostate: Secondary | ICD-10-CM | POA: Diagnosis not present

## 2015-07-22 ENCOUNTER — Telehealth: Payer: Self-pay

## 2015-07-22 LAB — RENAL FUNCTION PANEL
ALBUMIN: 4.4 g/dL (ref 3.5–4.7)
BUN/Creatinine Ratio: 13 (ref 10–24)
BUN: 12 mg/dL (ref 8–27)
CO2: 24 mmol/L (ref 18–29)
Calcium: 9.2 mg/dL (ref 8.6–10.2)
Chloride: 100 mmol/L (ref 96–106)
Creatinine, Ser: 0.93 mg/dL (ref 0.76–1.27)
GFR, EST AFRICAN AMERICAN: 89 mL/min/{1.73_m2} (ref >59)
GFR, EST NON AFRICAN AMERICAN: 77 mL/min/{1.73_m2} (ref >59)
Glucose: 133 mg/dL — ABNORMAL HIGH (ref 65–99)
PHOSPHORUS: 3.2 mg/dL (ref 2.5–4.5)
Potassium: 4.6 mmol/L (ref 3.5–5.2)
Sodium: 142 mmol/L (ref 134–144)

## 2015-07-22 LAB — PSA: Prostate Specific Ag, Serum: 0.1 ng/mL (ref 0.0–4.0)

## 2015-07-22 NOTE — Telephone Encounter (Signed)
-----   Message from Birdie Sons, MD sent at 07/22/2015  4:02 PM EDT ----- Blood sugar is moderately elevated at 133, but not quite in diabetic range. Need to avoid sweets and starchy foods. Need to follow up in 3 months to check blood sugar and A1c.

## 2015-07-22 NOTE — Telephone Encounter (Signed)
Patient has been advised. KW 

## 2015-07-30 ENCOUNTER — Encounter: Payer: Self-pay | Admitting: Family Medicine

## 2015-08-02 ENCOUNTER — Other Ambulatory Visit: Payer: Self-pay | Admitting: Family Medicine

## 2015-08-11 ENCOUNTER — Telehealth: Payer: Self-pay | Admitting: *Deleted

## 2015-08-11 NOTE — Telephone Encounter (Signed)
Pt returned you call.     Thank sTeri

## 2015-08-12 MED ORDER — INDOMETHACIN 50 MG PO CAPS: 50.0000 mg | ORAL_CAPSULE | Freq: Two times a day (BID) | ORAL | Status: DC

## 2015-08-12 NOTE — Telephone Encounter (Signed)
Rx for indomethacin 50 mg bid sent to pharmacy.

## 2015-08-19 DIAGNOSIS — H2513 Age-related nuclear cataract, bilateral: Secondary | ICD-10-CM | POA: Diagnosis not present

## 2015-09-24 DIAGNOSIS — L820 Inflamed seborrheic keratosis: Secondary | ICD-10-CM | POA: Diagnosis not present

## 2015-09-24 DIAGNOSIS — D485 Neoplasm of uncertain behavior of skin: Secondary | ICD-10-CM | POA: Diagnosis not present

## 2015-09-24 DIAGNOSIS — L578 Other skin changes due to chronic exposure to nonionizing radiation: Secondary | ICD-10-CM | POA: Diagnosis not present

## 2015-09-24 DIAGNOSIS — D0439 Carcinoma in situ of skin of other parts of face: Secondary | ICD-10-CM | POA: Diagnosis not present

## 2015-12-15 DIAGNOSIS — D0439 Carcinoma in situ of skin of other parts of face: Secondary | ICD-10-CM | POA: Diagnosis not present

## 2016-01-02 DIAGNOSIS — R69 Illness, unspecified: Secondary | ICD-10-CM | POA: Diagnosis not present

## 2016-02-02 ENCOUNTER — Telehealth: Payer: Self-pay | Admitting: Family Medicine

## 2016-02-02 MED ORDER — METOPROLOL SUCCINATE ER 50 MG PO TB24: 50.0000 mg | ORAL_TABLET | Freq: Every day | ORAL | 2 refills | Status: DC

## 2016-02-02 NOTE — Telephone Encounter (Signed)
Patient was advised. KW 

## 2016-02-02 NOTE — Telephone Encounter (Signed)
Pt is trying to get a refill on his Atenolol and [facility] is out of stock.  He said they told him it was out of stock all over town.  He has been trying to get a refill for a bout a month although he has 10 left he needs to get a refill soon.  Please advise.  Pt call back is [phone]  Thank sTeri

## 2016-02-02 NOTE — Telephone Encounter (Signed)
Please review. Thanks!  

## 2016-02-02 NOTE — Telephone Encounter (Signed)
Have sent order for metoprolol to [facility].

## 2016-03-11 DIAGNOSIS — H2512 Age-related nuclear cataract, left eye: Secondary | ICD-10-CM | POA: Diagnosis not present

## 2016-04-09 DIAGNOSIS — H2512 Age-related nuclear cataract, left eye: Secondary | ICD-10-CM | POA: Diagnosis not present

## 2016-04-13 ENCOUNTER — Encounter: Payer: Self-pay | Admitting: *Deleted

## 2016-04-21 ENCOUNTER — Encounter: Payer: Self-pay | Admitting: *Deleted

## 2016-04-21 ENCOUNTER — Ambulatory Visit
Admission: RE | Admit: 2016-04-21 | Discharge: 2016-04-21 | Disposition: A | Discharge status: Home / Self Care | Payer: Medicare HMO | Source: Ambulatory Visit | Attending: Ophthalmology | Admitting: Ophthalmology

## 2016-04-21 ENCOUNTER — Ambulatory Visit: Payer: Medicare HMO | Admitting: Anesthesiology

## 2016-04-21 ENCOUNTER — Encounter
Admission: RE | Disposition: A | Discharge status: Home / Self Care | Payer: Self-pay | Source: Ambulatory Visit | Attending: Ophthalmology

## 2016-04-21 DIAGNOSIS — Z8546 Personal history of malignant neoplasm of prostate: Secondary | ICD-10-CM | POA: Diagnosis not present

## 2016-04-21 DIAGNOSIS — Z79899 Other long term (current) drug therapy: Secondary | ICD-10-CM | POA: Insufficient documentation

## 2016-04-21 DIAGNOSIS — I10 Essential (primary) hypertension: Secondary | ICD-10-CM | POA: Insufficient documentation

## 2016-04-21 DIAGNOSIS — H2512 Age-related nuclear cataract, left eye: Secondary | ICD-10-CM | POA: Insufficient documentation

## 2016-04-21 HISTORY — PX: CATARACT EXTRACTION W/PHACO: SHX586

## 2016-04-21 SURGERY — PHACOEMULSIFICATION, CATARACT, WITH IOL INSERTION
Anesthesia: Monitor Anesthesia Care | Site: Eye | Laterality: Left | Wound class: Clean

## 2016-04-21 MED ORDER — SODIUM CHLORIDE 0.9 % IV SOLN
INTRAVENOUS | Status: DC
Start: 2016-04-21 — End: 2016-04-21
  Administered 2016-04-21: 08:00:00 via INTRAVENOUS

## 2016-04-21 MED ORDER — MOXIFLOXACIN HCL 0.5 % OP SOLN
OPHTHALMIC | Status: DC | PRN
  Administered 2016-04-21: 0.2 mL via OPHTHALMIC

## 2016-04-21 MED ORDER — LIDOCAINE HCL (PF) 4 % IJ SOLN
INTRAOCULAR | Status: DC | PRN
  Administered 2016-04-21: 2.25 mL via OPHTHALMIC

## 2016-04-21 MED ORDER — CYCLOPENTOLATE HCL 2 % OP SOLN
OPHTHALMIC | Status: AC
  Administered 2016-04-21: 1 [drp] via OPHTHALMIC
  Filled 2016-04-21: qty 2

## 2016-04-21 MED ORDER — EPINEPHRINE PF 1 MG/ML IJ SOLN
INTRAOCULAR | Status: DC | PRN
  Administered 2016-04-21: 1 mL via OPHTHALMIC

## 2016-04-21 MED ORDER — NA CHONDROIT SULF-NA HYALURON 40-17 MG/ML IO SOLN
INTRAOCULAR | Status: DC | PRN
  Administered 2016-04-21: 1 mL via INTRAOCULAR

## 2016-04-21 MED ORDER — ALFENTANIL 500 MCG/ML IJ INJ
INJECTION | INTRAMUSCULAR | Status: DC | PRN
  Administered 2016-04-21: 500 ug via INTRAVENOUS

## 2016-04-21 MED ORDER — CEFUROXIME OPHTHALMIC INJECTION 1 MG/0.1 ML
INJECTION | OPHTHALMIC | Status: AC
  Filled 2016-04-21: qty 0.1

## 2016-04-21 MED ORDER — CYCLOPENTOLATE HCL 2 % OP SOLN
1.0000 [drp] | OPHTHALMIC | Status: AC
  Administered 2016-04-21 (×4): 1 [drp] via OPHTHALMIC

## 2016-04-21 MED ORDER — MOXIFLOXACIN HCL 0.5 % OP SOLN
OPHTHALMIC | Status: AC
  Administered 2016-04-21: 1 [drp] via OPHTHALMIC
  Filled 2016-04-21: qty 3

## 2016-04-21 MED ORDER — PHENYLEPHRINE HCL 10 % OP SOLN
1.0000 [drp] | OPHTHALMIC | Status: AC
  Administered 2016-04-21 (×4): 1 [drp] via OPHTHALMIC

## 2016-04-21 MED ORDER — CEFUROXIME OPHTHALMIC INJECTION 1 MG/0.1 ML
INJECTION | OPHTHALMIC | Status: DC | PRN
  Administered 2016-04-21: .1 mL via INTRACAMERAL

## 2016-04-21 MED ORDER — HYALURONIDASE HUMAN 150 UNIT/ML IJ SOLN
INTRAMUSCULAR | Status: AC
  Filled 2016-04-21: qty 1

## 2016-04-21 MED ORDER — LIDOCAINE HCL (PF) 4 % IJ SOLN
INTRAMUSCULAR | Status: AC
  Filled 2016-04-21: qty 5

## 2016-04-21 MED ORDER — POVIDONE-IODINE 5 % OP SOLN
OPHTHALMIC | Status: AC
  Filled 2016-04-21: qty 30

## 2016-04-21 MED ORDER — PHENYLEPHRINE HCL 10 % OP SOLN
OPHTHALMIC | Status: AC
  Administered 2016-04-21: 1 [drp] via OPHTHALMIC
  Filled 2016-04-21: qty 5

## 2016-04-21 MED ORDER — TETRACAINE HCL 0.5 % OP SOLN
OPHTHALMIC | Status: AC
  Filled 2016-04-21: qty 2

## 2016-04-21 MED ORDER — EPINEPHRINE PF 1 MG/ML IJ SOLN
INTRAMUSCULAR | Status: AC
  Filled 2016-04-21: qty 1

## 2016-04-21 MED ORDER — BUPIVACAINE HCL (PF) 0.75 % IJ SOLN
INTRAMUSCULAR | Status: AC
  Filled 2016-04-21: qty 10

## 2016-04-21 MED ORDER — TETRACAINE HCL 0.5 % OP SOLN
OPHTHALMIC | Status: DC | PRN
Start: 2016-04-21 — End: 2016-04-21
  Administered 2016-04-21: 1 [drp] via OPHTHALMIC

## 2016-04-21 MED ORDER — CARBACHOL 0.01 % IO SOLN
INTRAOCULAR | Status: DC | PRN
  Administered 2016-04-21: .5 mL via INTRAOCULAR

## 2016-04-21 MED ORDER — LIDOCAINE HCL (PF) 4 % IJ SOLN
INTRAMUSCULAR | Status: DC | PRN
  Administered 2016-04-21: 4 mL via OPHTHALMIC

## 2016-04-21 MED ORDER — POVIDONE-IODINE 5 % OP SOLN
OPHTHALMIC | Status: DC | PRN
  Administered 2016-04-21: 1 via OPHTHALMIC

## 2016-04-21 MED ORDER — NA CHONDROIT SULF-NA HYALURON 40-17 MG/ML IO SOLN
INTRAOCULAR | Status: AC
  Filled 2016-04-21: qty 1

## 2016-04-21 MED ORDER — MOXIFLOXACIN HCL 0.5 % OP SOLN
1.0000 [drp] | OPHTHALMIC | Status: AC
  Administered 2016-04-21 (×3): 1 [drp] via OPHTHALMIC

## 2016-04-21 SURGICAL SUPPLY — 30 items
CANNULA ANT/CHMB 27GA (MISCELLANEOUS) ×2 IMPLANT
CORD BIP STRL DISP 12FT (MISCELLANEOUS) ×2 IMPLANT
CUP MEDICINE 2OZ PLAST GRAD ST (MISCELLANEOUS) ×2 IMPLANT
DRAPE XRAY CASSETTE 23X24 (DRAPES) ×2 IMPLANT
ERASER HMR WETFIELD 18G (MISCELLANEOUS) ×2 IMPLANT
GLOVE BIO SURGEON STRL SZ8 (GLOVE) ×2 IMPLANT
GLOVE SURG LX 6.5 MICRO (GLOVE) ×1
GLOVE SURG LX 8.0 MICRO (GLOVE) ×1
GLOVE SURG LX STRL 6.5 MICRO (GLOVE) ×1 IMPLANT
GLOVE SURG LX STRL 8.0 MICRO (GLOVE) ×1 IMPLANT
GOWN STRL REUS W/ TWL LRG LVL3 (GOWN DISPOSABLE) ×1 IMPLANT
GOWN STRL REUS W/ TWL XL LVL3 (GOWN DISPOSABLE) ×1 IMPLANT
GOWN STRL REUS W/TWL LRG LVL3 (GOWN DISPOSABLE) ×1
GOWN STRL REUS W/TWL XL LVL3 (GOWN DISPOSABLE) ×1
LENS IOL ACRSF IQ ULTRA 16.5 (Intraocular Lens) ×1 IMPLANT
LENS IOL ACRYSOF IQ 16.5 (Intraocular Lens) ×2 IMPLANT
PACK CATARACT (MISCELLANEOUS) ×2 IMPLANT
PACK CATARACT DINGLEDEIN LX (MISCELLANEOUS) ×2 IMPLANT
PACK EYE AFTER SURG (MISCELLANEOUS) ×2 IMPLANT
SHLD EYE VISITEC  UNIV (MISCELLANEOUS) ×2 IMPLANT
SOL BSS BAG (MISCELLANEOUS) ×2
SOL PREP PVP 2OZ (MISCELLANEOUS) ×2
SOLUTION BSS BAG (MISCELLANEOUS) ×1 IMPLANT
SOLUTION PREP PVP 2OZ (MISCELLANEOUS) ×1 IMPLANT
SUT SILK 5-0 (SUTURE) ×2 IMPLANT
SYR 3ML LL SCALE MARK (SYRINGE) ×2 IMPLANT
SYR 5ML LL (SYRINGE) ×2 IMPLANT
SYR TB 1ML 27GX1/2 LL (SYRINGE) ×2 IMPLANT
WATER STERILE IRR 250ML POUR (IV SOLUTION) ×2 IMPLANT
WIPE NON LINTING 3.25X3.25 (MISCELLANEOUS) ×2 IMPLANT

## 2016-04-21 NOTE — Anesthesia Post-op Follow-up Note (Cosign Needed)
Anesthesia QCDR form completed.        

## 2016-04-21 NOTE — Interval H&P Note (Signed)
History and Physical Interval Note:  04/21/2016 7:27 AM  Mark Hunter  has presented today for surgery, with the diagnosis of cataract  The various methods of treatment have been discussed with the patient and family. After consideration of risks, benefits and other options for treatment, the patient has consented to  Procedure(s): CATARACT EXTRACTION PHACO AND INTRAOCULAR LENS PLACEMENT ([place]) (Left) as a surgical intervention .  The patient's history has been reviewed, patient examined, no change in status, stable for surgery.  I have reviewed the patient's chart and labs.  Questions were answered to the patient's satisfaction.     [name]

## 2016-04-21 NOTE — Anesthesia Postprocedure Evaluation (Signed)
Anesthesia Post Note  Patient: Mark Hunter  Procedure(s) Performed: Procedure(s) (LRB): CATARACT EXTRACTION PHACO AND INTRAOCULAR LENS PLACEMENT (IOC) (Left)  Patient location during evaluation: PACU Anesthesia Type: MAC Level of consciousness: awake and alert Pain management: pain level controlled Vital Signs Assessment: post-procedure vital signs reviewed and stable Respiratory status: spontaneous breathing, nonlabored ventilation and respiratory function stable Cardiovascular status: stable and blood pressure returned to baseline Anesthetic complications: no     Last Vitals:  Vitals:   04/21/16 0951 04/21/16 0952  BP: (!) 156/58 (!) 156/58  Pulse: 61   Resp: 18 15  Temp: 36.5 C 36.4 C    Last Pain:  Vitals:   04/21/16 0952  TempSrc: Shane Crutch A

## 2016-04-21 NOTE — H&P (Signed)
See scanned note.

## 2016-04-21 NOTE — Op Note (Signed)
Date of Surgery: 04/21/2016 Date of Dictation: 04/21/2016 9:50 AM Pre-operative Diagnosis:  Nuclear Sclerotic Cataract left Eye Post-operative Diagnosis: same Procedure performed: Extra-capsular Cataract Extraction (ECCE) with placement of a posterior chamber intraocular lens (IOL) left Eye IOL:  Implant Name Type Inv. Item Serial No. Manufacturer Lot No. LRB No. Used  LENS IOL ACRYSOF IQ 16.5 - [id number] Intraocular Lens LENS IOL ACRYSOF IQ 16.5 [id number] ALCON   Left 1   Anesthesia: 2% Lidocaine and 4% Marcaine in a 50/50 mixture with 10 unites/ml of Hylenex given as a peribulbar Anesthesiologist: Anesthesiologist: Martha Clan, MD CRNA: Silvana Newness, CRNA Complications: none Estimated Blood Loss: less than 1 ml  Description of procedure:  The patient was given anesthesia and sedation via intravenous access. The patient was then prepped and draped in the usual fashion. A 25-gauge needle was bent for initiating the capsulorhexis. A 5-0 silk suture was placed through the conjunctiva superior and inferiorly to serve as bridle sutures. Hemostasis was obtained at the superior limbus using an eraser cautery. A partial thickness groove was made at the anterior surgical limbus with a 64 Beaver blade and this was dissected anteriorly with an [employer]. The anterior chamber was entered at 10 o'clock with a 1.0 mm paracentesis knife and through the lamellar dissection with a 2.6 mm Alcon keratome. Epi-Shugarcaine 0.5 CC [9 cc BSS Plus (Alcon), 3 cc 4% preservative-free lidocaine (Hospira) and 4 cc 1:1000 preservative-free, bisulfite-free epinephrine] was injected into the anterior chamber via the paracentesis tract. Epi-Shugarcaine 0.5 CC [9 cc BSS Plus (Alcon), 3 cc 4% preservative-free lidocaine (Hospira) and 4 cc 1:1000 preservative-free, bisulfite-free epinephrine] was injected into the anterior chamber via the paracentesis tract. DiscoVisc was injected to replace the aqueous and a  continuous tear curvilinear capsulorhexis was performed using a bent 25-gauge needle.  Balance salt on a syringe was used to perform hydro-dissection and phacoemulsification was carried out using a divide and conquer technique. Procedure(s) with comments: CATARACT EXTRACTION PHACO AND INTRAOCULAR LENS PLACEMENT (IOC) (Left) - [place] 01:27 AP% 21.8 CDE 34.78 Fluid pack lot # [id number]. The nucleus was removed "quqrter/quarter/half" and it appeared that the cortex came out with the nucleus. The pupil shrank down to about 3-4 mm but pressure from the phaco and I/A handpiece kept it open. Irrigation/aspiration was used to check for residual cortex - none was found - and the capsular bag was inflated with DiscoVisc. The intraocular lens was inserted into the capsular bag using a pre-loaded UltraSert Delivery System. Irrigation/aspiration was used to remove the residual DiscoVisc. A Kugland hook was used to push the iris back to confirm the lens was in the bag. The wound was inflated with balanced salt and checked for leaks. None were found. Miostat was injected via the paracentesis track and 0.1 ml of cefuroxime containing 1 mg of drug  was injected via the paracentesis track. The wound was checked for leaks again and none were found.   The bridal sutures were removed and two drops of Vigamox were placed on the eye. An eye shield was placed to protect the eye and the patient was discharged to the recovery area in good condition.   Areg Bialas MD

## 2016-04-21 NOTE — Anesthesia Preprocedure Evaluation (Signed)
Anesthesia Evaluation  Patient identified by MRN, date of birth, ID band Patient awake    Reviewed: Allergy & Precautions, NPO status , Patient's Chart, lab work & pertinent test results  History of Anesthesia Complications Negative for: history of anesthetic complications  Airway Mallampati: II  TM Distance: >3 FB Neck ROM: Full    Dental  (+) Teeth Intact   Pulmonary neg pulmonary ROS,           Cardiovascular hypertension, Pt. on medications and Pt. on home beta blockers      Neuro/Psych negative neurological ROS     GI/Hepatic negative GI ROS, Neg liver ROS,   Endo/Other  negative endocrine ROS  Renal/GU negative Renal ROS     Musculoskeletal   Abdominal   Peds  Hematology negative hematology ROS (+)   Anesthesia Other Findings Past Medical History: No date: BPH (benign prostatic hyperplasia) 2003: Cancer ([place])     Comment: prostate No date: Gout No date: History of measles No date: History of mumps No date: Hypertension   Reproductive/Obstetrics                             Anesthesia Physical  Anesthesia Plan  ASA: II  Anesthesia Plan: MAC   Post-op Pain Management:    Induction: Intravenous  Airway Management Planned: Nasal Cannula  Additional Equipment:   Intra-op Plan:   Post-operative Plan:   Informed Consent: I have reviewed the patients History and Physical, chart, labs and discussed the procedure including the risks, benefits and alternatives for the proposed anesthesia with the patient or authorized representative who has indicated his/her understanding and acceptance.     Plan Discussed with: CRNA, Surgeon and Anesthesiologist  Anesthesia Plan Comments:         Anesthesia Quick Evaluation

## 2016-04-21 NOTE — Interval H&P Note (Signed)
History and Physical Interval Note:  04/21/2016 7:27 AM  Mark Hunter  has presented today for surgery, with the diagnosis of cataract  The various methods of treatment have been discussed with the patient and family. After consideration of risks, benefits and other options for treatment, the patient has consented to  Procedure(s): CATARACT EXTRACTION PHACO AND INTRAOCULAR LENS PLACEMENT ([place]) (Left) as a surgical intervention .  The patient's history has been reviewed, patient examined, no change in status, stable for surgery.  I have reviewed the patient's chart and labs.  Questions were answered to the patient's satisfaction.     [name]

## 2016-04-21 NOTE — Transfer of Care (Signed)
Immediate Anesthesia Transfer of Care Note  Patient: Mark Hunter  Procedure(s) Performed: Procedure(s) with comments: CATARACT EXTRACTION PHACO AND INTRAOCULAR LENS PLACEMENT (IOC) (Left) - [place] 01:27 AP% 21.8 CDE 34.78 Fluid pack lot # [id number]  Patient Location: PACU  Anesthesia Type:MAC  Level of Consciousness: awake, alert , oriented and patient cooperative  Airway & Oxygen Therapy: Patient Spontanous Breathing  Post-op Assessment: Report given to RN, Post -op Vital signs reviewed and stable and Patient moving all extremities X 4  Post vital signs: Reviewed and stable  Last Vitals:  Vitals:   04/21/16 0726 04/21/16 0952  BP: (!) 163/63 (!) 156/58  Pulse: 65   Resp: 18 15  Temp: 36.6 C 36.4 C    Last Pain:  Vitals:   04/21/16 0952  TempSrc: Oral         Complications: No apparent anesthesia complications

## 2016-04-21 NOTE — Discharge Instructions (Addendum)
See handout.  Eye Surgery Discharge Instructions  Expect mild scratchy sensation or mild soreness. DO NOT RUB YOUR EYE!  The day of surgery:  Minimal physical activity, but bed rest is not required  No reading, computer work, or close hand work  No bending, lifting, or straining.  May watch TV  For 24 hours:  No driving, legal decisions, or alcoholic beverages  Safety precautions  Eat anything you prefer: It is better to start with liquids, then soup then solid foods.  _____ Eye patch should be worn until postoperative exam tomorrow.  ____ Solar shield eyeglasses should be worn for comfort in the sunlight/patch while sleeping  Resume all regular medications including aspirin or Coumadin if these were discontinued prior to surgery. You may shower, bathe, shave, or wash your hair. Tylenol may be taken for mild discomfort.  Call your doctor if you experience significant pain, nausea, or vomiting, fever > 101 or other signs of infection. [phone] or [phone] Specific instructions:  Follow-up Information    DINGELDEIN,STEVEN, MD Follow up on 04/22/2016.   Specialty:  Ophthalmology Why:  10:30 am Contact information: [address]   [place] [address] [phone]

## 2016-04-26 ENCOUNTER — Telehealth: Payer: Self-pay | Admitting: *Deleted

## 2016-04-26 NOTE — Progress Notes (Unsigned)
LOV 07/17/2015. BP at that time was 140/58.

## 2016-04-26 NOTE — Telephone Encounter (Signed)
Patient called office requesting to stop by office to have his bp check. Patient stated bp was 180/80 at home and that he is feeling dizzy. Per Dr. Caryn Section okay for pt to stop by office.

## 2016-04-29 ENCOUNTER — Ambulatory Visit (INDEPENDENT_AMBULATORY_CARE_PROVIDER_SITE_OTHER): Payer: Medicare HMO | Admitting: Family Medicine

## 2016-04-29 ENCOUNTER — Encounter: Payer: Self-pay | Admitting: Family Medicine

## 2016-04-29 VITALS — BP 142/72 | HR 72 | Temp 98.6°F | Resp 16 | Ht 68.0 in | Wt 188.0 lb

## 2016-04-29 DIAGNOSIS — Z8589 Personal history of malignant neoplasm of other organs and systems: Secondary | ICD-10-CM | POA: Diagnosis not present

## 2016-04-29 DIAGNOSIS — I10 Essential (primary) hypertension: Secondary | ICD-10-CM

## 2016-04-29 DIAGNOSIS — R739 Hyperglycemia, unspecified: Secondary | ICD-10-CM | POA: Diagnosis not present

## 2016-04-29 LAB — POCT GLYCOSYLATED HEMOGLOBIN (HGB A1C)
ESTIMATED AVERAGE GLUCOSE: 123
Hemoglobin A1C: 5.9

## 2016-04-29 MED ORDER — AMLODIPINE BESYLATE 10 MG PO TABS: 10.0000 mg | ORAL_TABLET | Freq: Every day | ORAL | 4 refills | Status: DC

## 2016-04-29 MED ORDER — AMLODIPINE BESYLATE 10 MG PO TABS: 5.0000 mg | ORAL_TABLET | Freq: Every day | ORAL | 4 refills | Status: DC

## 2016-04-29 NOTE — Progress Notes (Signed)
Patient: Mark Hunter Male    DOB: [birth year]   83 y.o.   MRN: [id number] Visit Date: 04/29/2016  Today's Provider: Lelon Huh, MD   Chief Complaint  Patient presents with  . Headache  . Hyperglycemia   Subjective:    HPI  Hypertension, follow-up:  BP Readings from Last 3 Encounters:  04/29/16 (!) 142/72  04/26/16 (!) 170/62  04/21/16 (!) 151/54    He was last seen for hypertension 10 months ago.  BP at that visit was 140/58. Management since that visit includes changed Atenolol to Metoprolol. Patient also reports that since last week, he has been taking a total of 10mg  of Amlodipine.  He reports good compliance with treatment. He is not having side effects.  He is exercising. He is adherent to low salt diet.   Outside blood pressures are checked daily. Patient reports that it averages 170s-160s/90s-80s. He is experiencing occasional headaches.  Patient denies chest pain, exertional chest pressure/discomfort, lower extremity edema and palpitations.      Weight trend: stable Wt Readings from Last 3 Encounters:  04/29/16 188 lb (85.3 kg)  04/21/16 190 lb (86.2 kg)  07/17/15 203 lb (92.1 kg)    Current diet: well balanced  Had cataract surgery 04/21/2016 and BP was normal, but started feeling strange a few days later and BP has since been running. He did change from atenolol to metoprolol in November.   Has had no other medication changes. No other illness. No sinus or allergy medications.     Hyperglycemia, follow up: Patient was last seen 10 months ago. No changes were made in medications. Patient was advised to avoid sweet and starchy foods. He reports that he has maintained a generally healthy diet.      Lab Results  Component Value Date   HGBA1C 5.9 04/29/2016    No Known Allergies   Current Outpatient Prescriptions:  .  amLODipine (NORVASC) 5 MG tablet, Take 2 (TWO) tablets (10 mg total) by mouth daily., Disp: 90 tablet, Rfl: 3 .  aspirin  81 MG tablet, Take 81 mg by mouth daily., Disp: , Rfl:  .  metoprolol succinate (TOPROL-XL) 50 MG 24 hr tablet, Take 1 tablet (50 mg total) by mouth daily., Disp: 90 tablet, Rfl: 2 .  Multiple Vitamin (MULTI-VITAMINS PO), Take 1 tablet by mouth once., Disp: , Rfl:  .  Omega-3 Fatty Acids (FISH OIL PO), Take 1 capsule by mouth once., Disp: , Rfl:  .  tamsulosin (FLOMAX) 0.4 MG CAPS capsule, Take 1 capsule (0.4 mg total) by mouth daily., Disp: 90 capsule, Rfl: 3  Review of Systems  Constitutional: Negative for activity change, appetite change, chills, diaphoresis, fatigue, fever and unexpected weight change.  Respiratory: Negative.   Cardiovascular: Negative for chest pain, palpitations and leg swelling.  Endocrine: Negative.   Musculoskeletal: Negative.   Neurological: Positive for headaches.    Social History  Substance Use Topics  . Smoking status: Never Smoker  . Smokeless tobacco: Never Used  . Alcohol use Yes     Comment: one glass a day   Objective:   BP (!) 142/72 (BP Location: Left Arm, Patient Position: Sitting, Cuff Size: Normal)   Pulse 72   Temp 98.6 F (37 C)   Resp 16   Ht 5\' 8"  (1.727 m)   Wt 188 lb (85.3 kg)   BMI 28.59 kg/m   Physical Exam   General Appearance:    Alert, cooperative, no distress  Eyes:  PERRL, conjunctiva/corneas clear, EOM's intact       Lungs:     Clear to auscultation bilaterally, respirations unlabored  Heart:    Regular rate and rhythm  Neurologic:   Awake, alert, oriented x 3. No apparent focal neurological           defect.       Results for orders placed or performed in visit on 04/29/16  POCT glycosylated hemoglobin (Hb A1C)  Result Value Ref Range   Hemoglobin A1C 5.9    Est. average glucose Bld gHb Est-mCnc 123        Assessment & Plan:     1. Hyperglycemia Stable, check twice a year. Counseled regarding low glycemic index diet and exercise.  - POCT glycosylated hemoglobin (Hb A1C)  2. Essential (primary)  hypertension Better since doubling amlodipine. Sent new rx for 10mg  tablets.  - Renal function panel - TSH - Lipid panel  Return in about 6 weeks (around 06/10/2016).       Lelon Huh, MD  [facility]

## 2016-04-30 LAB — RENAL FUNCTION PANEL
Albumin: 4.8 g/dL — ABNORMAL HIGH (ref 3.5–4.7)
BUN / CREAT RATIO: 14 (ref 10–24)
BUN: 14 mg/dL (ref 8–27)
CO2: 23 mmol/L (ref 18–29)
CREATININE: 0.98 mg/dL (ref 0.76–1.27)
Calcium: 9.5 mg/dL (ref 8.6–10.2)
Chloride: 101 mmol/L (ref 96–106)
GFR calc Af Amer: 83 mL/min/{1.73_m2} (ref >59)
GFR calc non Af Amer: 72 mL/min/{1.73_m2} (ref >59)
Glucose: 104 mg/dL — ABNORMAL HIGH (ref 65–99)
Phosphorus: 3.9 mg/dL (ref 2.5–4.5)
Potassium: 4.8 mmol/L (ref 3.5–5.2)
Sodium: 141 mmol/L (ref 134–144)

## 2016-04-30 LAB — LIPID PANEL
Chol/HDL Ratio: 4.2 ratio units (ref 0.0–5.0)
Cholesterol, Total: 163 mg/dL (ref 100–199)
HDL: 39 mg/dL — AB (ref >39)
LDL Calculated: 89 mg/dL (ref 0–99)
Triglycerides: 176 mg/dL — ABNORMAL HIGH (ref 0–149)
VLDL CHOLESTEROL CAL: 35 mg/dL (ref 5–40)

## 2016-04-30 LAB — TSH: TSH: 1.6 u[IU]/mL (ref 0.450–4.500)

## 2016-05-18 DIAGNOSIS — Z961 Presence of intraocular lens: Secondary | ICD-10-CM | POA: Diagnosis not present

## 2016-06-21 DIAGNOSIS — L570 Actinic keratosis: Secondary | ICD-10-CM | POA: Diagnosis not present

## 2016-06-21 DIAGNOSIS — D485 Neoplasm of uncertain behavior of skin: Secondary | ICD-10-CM | POA: Diagnosis not present

## 2016-06-21 DIAGNOSIS — Z859 Personal history of malignant neoplasm, unspecified: Secondary | ICD-10-CM | POA: Diagnosis not present

## 2016-06-23 ENCOUNTER — Ambulatory Visit (INDEPENDENT_AMBULATORY_CARE_PROVIDER_SITE_OTHER): Payer: Medicare HMO

## 2016-06-23 ENCOUNTER — Ambulatory Visit (INDEPENDENT_AMBULATORY_CARE_PROVIDER_SITE_OTHER): Payer: Medicare HMO | Admitting: Family Medicine

## 2016-06-23 VITALS — BP 140/58 | HR 68 | Temp 99.1°F | Ht 68.0 in | Wt 189.0 lb

## 2016-06-23 VITALS — BP 132/64

## 2016-06-23 DIAGNOSIS — R7303 Prediabetes: Secondary | ICD-10-CM | POA: Diagnosis not present

## 2016-06-23 DIAGNOSIS — I10 Essential (primary) hypertension: Secondary | ICD-10-CM | POA: Diagnosis not present

## 2016-06-23 DIAGNOSIS — Z0000 Encounter for general adult medical examination without abnormal findings: Secondary | ICD-10-CM | POA: Diagnosis not present

## 2016-06-23 NOTE — Patient Instructions (Signed)
[name] , Thank you for taking time to come for your Medicare Wellness Visit. I appreciate your ongoing commitment to your health goals. Please review the following plan we discussed and let me know if I can assist you in the future.   Screening recommendations/referrals: Colonoscopy: done 04/11/07 Recommended yearly ophthalmology/optometry visit for glaucoma screening and checkup Recommended yearly dental visit for hygiene and checkup  Vaccinations: Influenza vaccine: done 12/2015 Pneumococcal vaccine: completed series Tdap vaccine: done 07/17/15 Shingles vaccine: completed   Advanced directives: Requested copy  Next appointment: None  Preventive Care 7 Years and Older, Male Preventive care refers to lifestyle choices and visits with your health care provider that can promote health and wellness. What does preventive care include?  A yearly physical exam. This is also called an annual well check.  Dental exams once or twice a year.  Routine eye exams. Ask your health care provider how often you should have your eyes checked.  Personal lifestyle choices, including:  Daily care of your teeth and gums.  Regular physical activity.  Eating a healthy diet.  Avoiding tobacco and drug use.  Limiting alcohol use.  Practicing safe sex.  Taking low doses of aspirin every day.  Taking vitamin and mineral supplements as recommended by your health care provider. What happens during an annual well check? The services and screenings done by your health care provider during your annual well check will depend on your age, overall health, lifestyle risk factors, and family history of disease. Counseling  Your health care provider may ask you questions about your:  Alcohol use.  Tobacco use.  Drug use.  Emotional well-being.  Home and relationship well-being.  Sexual activity.  Eating habits.  History of falls.  Memory and ability to understand (cognition).  Work  and work Statistician. Screening  You may have the following tests or measurements:  Height, weight, and BMI.  Blood pressure.  Lipid and cholesterol levels. These may be checked every 5 years, or more frequently if you are over 49 years old.  Skin check.  Lung cancer screening. You may have this screening every year starting at age 34 if you have a 30-pack-year history of smoking and currently smoke or have quit within the past 15 years.  Fecal occult blood test (FOBT) of the stool. You may have this test every year starting at age 71.  Flexible sigmoidoscopy or colonoscopy. You may have a sigmoidoscopy every 5 years or a colonoscopy every 10 years starting at age 32.  Prostate cancer screening. Recommendations will vary depending on your family history and other risks.  Hepatitis C blood test.  Hepatitis B blood test.  Sexually transmitted disease (STD) testing.  Diabetes screening. This is done by checking your blood sugar (glucose) after you have not eaten for a while (fasting). You may have this done every 1-3 years.  Abdominal aortic aneurysm (AAA) screening. You may need this if you are a current or former smoker.  Osteoporosis. You may be screened starting at age 45 if you are at high risk. Talk with your health care provider about your test results, treatment options, and if necessary, the need for more tests. Vaccines  Your health care provider may recommend certain vaccines, such as:  Influenza vaccine. This is recommended every year.  Tetanus, diphtheria, and acellular pertussis (Tdap, Td) vaccine. You may need a Td booster every 10 years.  Zoster vaccine. You may need this after age 63.  Pneumococcal 13-valent conjugate (PCV13) vaccine. One dose  is recommended after age 39.  Pneumococcal polysaccharide (PPSV23) vaccine. One dose is recommended after age 1. Talk to your health care provider about which screenings and vaccines you need and how often you need  them. This information is not intended to replace advice given to you by your health care provider. Make sure you discuss any questions you have with your health care provider. Document Released: 04/11/2015 Document Revised: 12/03/2015 Document Reviewed: 01/14/2015 Elsevier Interactive Patient Education  2017 [employer] Prevention in the Home Falls can cause injuries. They can happen to people of all ages. There are many things you can do to make your home safe and to help prevent falls. What can I do on the outside of my home?  Regularly fix the edges of walkways and driveways and fix any cracks.  Remove anything that might make you trip as you walk through a door, such as a raised step or threshold.  Trim any bushes or trees on the path to your home.  Use bright outdoor lighting.  Clear any walking paths of anything that might make someone trip, such as rocks or tools.  Regularly check to see if handrails are loose or broken. Make sure that both sides of any steps have handrails.  Any raised decks and porches should have guardrails on the edges.  Have any leaves, snow, or ice cleared regularly.  Use sand or salt on walking paths during winter.  Clean up any spills in your garage right away. This includes oil or grease spills. What can I do in the bathroom?  Use night lights.  Install grab bars by the toilet and in the tub and shower. Do not use towel bars as grab bars.  Use non-skid mats or decals in the tub or shower.  If you need to sit down in the shower, use a plastic, non-slip stool.  Keep the floor dry. Clean up any water that spills on the floor as soon as it happens.  Remove soap buildup in the tub or shower regularly.  Attach bath mats securely with double-sided non-slip rug tape.  Do not have throw rugs and other things on the floor that can make you trip. What can I do in the bedroom?  Use night lights.  Make sure that you have a light by your  bed that is easy to reach.  Do not use any sheets or blankets that are too big for your bed. They should not hang down onto the floor.  Have a firm chair that has side arms. You can use this for support while you get dressed.  Do not have throw rugs and other things on the floor that can make you trip. What can I do in the kitchen?  Clean up any spills right away.  Avoid walking on wet floors.  Keep items that you use a lot in easy-to-reach places.  If you need to reach something above you, use a strong step stool that has a grab bar.  Keep electrical cords out of the way.  Do not use floor polish or wax that makes floors slippery. If you must use wax, use non-skid floor wax.  Do not have throw rugs and other things on the floor that can make you trip. What can I do with my stairs?  Do not leave any items on the stairs.  Make sure that there are handrails on both sides of the stairs and use them. Fix handrails that are broken or loose.  Make sure that handrails are as long as the stairways.  Check any carpeting to make sure that it is firmly attached to the stairs. Fix any carpet that is loose or worn.  Avoid having throw rugs at the top or bottom of the stairs. If you do have throw rugs, attach them to the floor with carpet tape.  Make sure that you have a light switch at the top of the stairs and the bottom of the stairs. If you do not have them, ask someone to add them for you. What else can I do to help prevent falls?  Wear shoes that:  Do not have high heels.  Have rubber bottoms.  Are comfortable and fit you well.  Are closed at the toe. Do not wear sandals.  If you use a stepladder:  Make sure that it is fully opened. Do not climb a closed stepladder.  Make sure that both sides of the stepladder are locked into place.  Ask someone to hold it for you, if possible.  Clearly mark and make sure that you can see:  Any grab bars or handrails.  First and last  steps.  Where the edge of each step is.  Use tools that help you move around (mobility aids) if they are needed. These include:  Canes.  Walkers.  Scooters.  Crutches.  Turn on the lights when you go into a dark area. Replace any light bulbs as soon as they burn out.  Set up your furniture so you have a clear path. Avoid moving your furniture around.  If any of your floors are uneven, fix them.  If there are any pets around you, be aware of where they are.  Review your medicines with your doctor. Some medicines can make you feel dizzy. This can increase your chance of falling. Ask your doctor what other things that you can do to help prevent falls. This information is not intended to replace advice given to you by your health care provider. Make sure you discuss any questions you have with your health care provider. Document Released: 01/09/2009 Document Revised: 08/21/2015 Document Reviewed: 04/19/2014 Elsevier Interactive Patient Education  2017 [employer].

## 2016-06-23 NOTE — Progress Notes (Signed)
Patient: Mark Hunter Male    DOB: October 26, 1931   83 y.o.   MRN: [id number] Visit Date: 06/23/2016  Today's Provider: Lelon Huh, MD   Chief Complaint  Patient presents with  . Follow-up  . Hypertension   Subjective:    HPI     Hypertension, follow-up:  BP Readings from Last 3 Encounters:  06/23/16 132/64  06/23/16 (!) 140/58  04/29/16 (!) 142/72    He was last seen for hypertension 1 months ago.  BP at that visit was 142/72. Management since that visit includes; no changes.He reports good compliance with treatment. He is not having side effects. none He is exercising. He is adherent to low salt diet.   Outside blood pressures are 140/73. He is experiencing none.  Patient denies none.   Cardiovascular risk factors include pre-diabetes.  Use of agents associated with hypertension: none.   ----------------------------------------------------------------  Hyperglycemia From 04/29/2016-Stable, check twice a year. Counseled regarding low glycemic index diet and exercise.  Last A1c 5.9  No Known Allergies   Current Outpatient Prescriptions:  .  amLODipine (NORVASC) 10 MG tablet, Take 1 tablet (10 mg total) by mouth daily., Disp: 90 tablet, Rfl: 4 .  aspirin 81 MG tablet, Take 81 mg by mouth daily., Disp: , Rfl:  .  metoprolol succinate (TOPROL-XL) 50 MG 24 hr tablet, Take 1 tablet (50 mg total) by mouth daily., Disp: 90 tablet, Rfl: 2 .  Multiple Vitamin (MULTI-VITAMINS PO), Take 1 tablet by mouth once., Disp: , Rfl:  .  Omega-3 Fatty Acids (FISH OIL PO), Take 1 capsule by mouth once., Disp: , Rfl:  .  tamsulosin (FLOMAX) 0.4 MG CAPS capsule, Take 1 capsule (0.4 mg total) by mouth daily., Disp: 90 capsule, Rfl: 3  Review of Systems  Constitutional: Negative for appetite change, chills and fever.  Respiratory: Negative for chest tightness, shortness of breath and wheezing.   Cardiovascular: Negative for chest pain and palpitations.  Gastrointestinal:  Negative for abdominal pain, nausea and vomiting.  Neurological: Positive for numbness.    Social History  Substance Use Topics  . Smoking status: Never Smoker  . Smokeless tobacco: Never Used  . Alcohol use 4.2 oz/week    7 Cans of beer per week   Objective:    Vital Signs - Last Recorded  Most recent update: 06/23/2016 10:43 AM by Fabio Neighbors, LPN  BP    782/95 (BP Location: Right Arm)     Pulse  68     Temp  99.1 F (37.3 C) (Oral)     Ht  5\' 8"  (1.727 m)     Wt  189 lb (85.7 kg)      BMI  28.74 kg/m      Vitals History  BMI and BSA Data   Body Mass Index: 28.74 kg/m Body Surface Area: 2.03 m   Vitals:   06/23/16 1140  BP: 132/64    Physical Exam   General Appearance:    Alert, cooperative, no distress  Eyes:    PERRL, conjunctiva/corneas clear, EOM's intact       Lungs:     Clear to auscultation bilaterally, respirations unlabored  Heart:    Regular rate and rhythm  Neurologic:   Awake, alert, oriented x 3. No apparent focal neurological           defect.           Assessment & Plan:     1. Essential (primary) hypertension Well  controlled.  Continue current medications.   - EKG 12-Lead  2. Pre-diabetes Doing well with diet. Encourage to exercise more.        Lelon Huh, MD  [facility]

## 2016-06-23 NOTE — Progress Notes (Signed)
Subjective:   Mark Hunter is a 83 y.o. male who presents for Medicare Annual/Subsequent preventive examination.  Review of Systems:  N/A Cardiac Risk Factors include: advanced age (>47men, >77 women);hypertension;male gender;sedentary lifestyle     Objective:    Vitals: BP (!) 140/58 (BP Location: Right Arm)   Pulse 68   Temp 99.1 F (37.3 C) (Oral)   Ht 5\' 8"  (1.727 m)   Wt 189 lb (85.7 kg)   BMI 28.74 kg/m   Body mass index is 28.74 kg/m.  Tobacco History  Smoking Status  . Never Smoker  Smokeless Tobacco  . Never Used     Counseling given: Not Answered   Past Medical History:  Diagnosis Date  . BPH (benign prostatic hyperplasia)   . Cancer [facility]) 2003   prostate  . Gout   . History of measles   . History of mumps   . Hypertension    Past Surgical History:  Procedure Laterality Date  . CATARACT EXTRACTION W/PHACO Right 12/16/2014   Procedure: CATARACT EXTRACTION PHACO AND INTRAOCULAR LENS PLACEMENT ([place]);  Surgeon: Estill Cotta, MD;  Location: [facility] ORS;  Service: Ophthalmology;  Laterality: Right;  [place]   1:12   . CATARACT EXTRACTION W/PHACO Left 04/21/2016   Procedure: CATARACT EXTRACTION PHACO AND INTRAOCULAR LENS PLACEMENT ([facility]);  Surgeon: Estill Cotta, MD;  Location: [facility] ORS;  Service: Ophthalmology;  Laterality: Left;  [place] 01:27 AP% 21.8 CDE 34.78 Fluid pack lot # [id number]  . COLONOSCOPY  04/11/2007   Dr. Donnella Sham, [facility]. Hyperplastic polyps; radiation colitis, multiple colonic angioectasias  . LEG / ANKLE SOFT TISSUE BIOPSY    . PROSTATE SURGERY  02/2002   seed implant  . SHOULDER ARTHROSCOPY W/ ROTATOR CUFF REPAIR Right 05/03/2010   Family History  Problem Relation Age of Onset  . Stroke Father   . Cancer Brother     of eye, removed eyeball  . Diabetes Neg Hx   . Heart disease Neg Hx    History  Sexual Activity  . Sexual activity: Not on file    Outpatient Encounter Prescriptions as of 06/23/2016  Medication Sig  . amLODipine  (NORVASC) 10 MG tablet Take 1 tablet (10 mg total) by mouth daily.  Marland Kitchen aspirin 81 MG tablet Take 81 mg by mouth daily.  . metoprolol succinate (TOPROL-XL) 50 MG 24 hr tablet Take 1 tablet (50 mg total) by mouth daily.  . Multiple Vitamin (MULTI-VITAMINS PO) Take 1 tablet by mouth once.  . Omega-3 Fatty Acids (FISH OIL PO) Take 1 capsule by mouth once.  . tamsulosin (FLOMAX) 0.4 MG CAPS capsule Take 1 capsule (0.4 mg total) by mouth daily.   No facility-administered encounter medications on file as of 06/23/2016.     Activities of Daily Living In your present state of health, do you have any difficulty performing the following activities: 06/23/2016 07/17/2015  Hearing? N N  Vision? N N  Difficulty concentrating or making decisions? N N  Walking or climbing stairs? N N  Dressing or bathing? N N  Doing errands, shopping? N N  Preparing Food and eating ? N -  Using the Toilet? N -  In the past six months, have you accidently leaked urine? N -  Do you have problems with loss of bowel control? N -  Managing your Medications? N -  Managing your Finances? N -  Housekeeping or managing your Housekeeping? N -  Some recent data might be hidden    Patient Care Team: Kirstie Peri  Caryn Section, MD as PCP - General (Family Medicine) Estill Cotta, MD as Consulting Physician (Ophthalmology) Jannet Mantis, MD as Consulting Physician (Dermatology)   Assessment:    Exercise Activities and Dietary recommendations Current Exercise Habits: Home exercise routine, Type of exercise: walking, Time (Minutes): 45 (to an hour), Frequency (Times/Week): 2, Weekly Exercise (Minutes/Week): 90, Intensity: Mild  Goals    . Exercise           Pt to increase exercise to walking 1 hour every day.      Fall Risk Fall Risk  06/23/2016 07/17/2015  Falls in the past year? No No   Depression Screen PHQ 2/9 Scores 06/23/2016 06/23/2016 07/17/2015  PHQ - 2 Score 0 0 0  PHQ- 9 Score 0 - 0    Cognitive Function      6CIT Screen 06/23/2016  What Year? 0 points  What month? 0 points  What time? 0 points  Count back from 20 0 points  Months in reverse 0 points  Repeat phrase 0 points  Total Score 0    Immunization History  Administered Date(s) Administered  . Pneumococcal Conjugate-13 04/23/2014  . Pneumococcal Polysaccharide-23 01/22/2004  . Tdap 07/17/2015  . Zoster 04/13/2006   Screening Tests Health Maintenance  Topic Date Due  . TETANUS/TDAP  07/16/2025  . INFLUENZA VACCINE  Completed  . PNA vac Low Risk Adult  Completed      Plan:  I have personally reviewed and addressed the Medicare Annual Wellness questionnaire and have noted the following in the patient's chart:  A. Medical and social history B. Use of alcohol, tobacco or illicit drugs  C. Current medications and supplements D. Functional ability and status E.  Nutritional status F.  Physical activity G. Advance directives H. List of other physicians I.  Hospitalizations, surgeries, and ER visits in previous 12 months J.  [place] such as hearing and vision if needed, cognitive and depression L. Referrals and appointments - none  In addition, I have reviewed and discussed with patient certain preventive protocols, quality metrics, and best practice recommendations. A written personalized care plan for preventive services as well as general preventive health recommendations were provided to patient.  See attached scanned questionnaire for additional information.   Signed,  Fabio Neighbors, LPN Nurse Health Advisor   MD Recommendations: None.  I have reviewed the health advisor's note, was available for consultation, and agree with documentation and plan  Lelon Huh, MD

## 2016-07-05 ENCOUNTER — Other Ambulatory Visit: Payer: Self-pay | Admitting: Family Medicine

## 2016-07-05 MED ORDER — TAMSULOSIN HCL 0.4 MG PO CAPS: 0.4000 mg | ORAL_CAPSULE | Freq: Every day | ORAL | 4 refills | Status: DC

## 2016-07-05 NOTE — Telephone Encounter (Signed)
Pt needs refill                tamsulosin (FLOMAX) 0.4 MG CAPS capsule    [facility] in [place]  This is a new pharmacy for him.  He needs a one yr supply  Thank sTeri

## 2016-07-21 DIAGNOSIS — D229 Melanocytic nevi, unspecified: Secondary | ICD-10-CM | POA: Diagnosis not present

## 2016-07-21 DIAGNOSIS — D485 Neoplasm of uncertain behavior of skin: Secondary | ICD-10-CM | POA: Diagnosis not present

## 2016-10-02 ENCOUNTER — Other Ambulatory Visit: Payer: Self-pay | Admitting: Family Medicine

## 2016-12-14 HISTORY — PX: SQUAMOUS CELL CARCINOMA EXCISION: SHX2433

## 2017-01-17 DIAGNOSIS — R69 Illness, unspecified: Secondary | ICD-10-CM | POA: Diagnosis not present

## 2017-01-24 DIAGNOSIS — L570 Actinic keratosis: Secondary | ICD-10-CM | POA: Diagnosis not present

## 2017-01-24 DIAGNOSIS — Z859 Personal history of malignant neoplasm, unspecified: Secondary | ICD-10-CM | POA: Diagnosis not present

## 2017-01-24 DIAGNOSIS — L578 Other skin changes due to chronic exposure to nonionizing radiation: Secondary | ICD-10-CM | POA: Diagnosis not present

## 2017-01-24 DIAGNOSIS — Z86018 Personal history of other benign neoplasm: Secondary | ICD-10-CM | POA: Diagnosis not present

## 2017-01-24 DIAGNOSIS — Z872 Personal history of diseases of the skin and subcutaneous tissue: Secondary | ICD-10-CM | POA: Diagnosis not present

## 2017-01-28 DIAGNOSIS — Z961 Presence of intraocular lens: Secondary | ICD-10-CM | POA: Diagnosis not present

## 2017-05-05 ENCOUNTER — Telehealth: Payer: Self-pay | Admitting: Family Medicine

## 2017-05-06 NOTE — Telephone Encounter (Signed)
Appt scheduled

## 2017-06-24 ENCOUNTER — Encounter: Payer: Medicare HMO | Admitting: Family Medicine

## 2017-07-03 ENCOUNTER — Other Ambulatory Visit: Payer: Self-pay | Admitting: Family Medicine

## 2017-07-13 ENCOUNTER — Ambulatory Visit (INDEPENDENT_AMBULATORY_CARE_PROVIDER_SITE_OTHER): Payer: Medicare HMO

## 2017-07-13 ENCOUNTER — Ambulatory Visit (INDEPENDENT_AMBULATORY_CARE_PROVIDER_SITE_OTHER): Payer: Medicare HMO | Admitting: Family Medicine

## 2017-07-13 ENCOUNTER — Encounter: Payer: Self-pay | Admitting: Family Medicine

## 2017-07-13 VITALS — BP 138/48 | HR 72 | Temp 99.0°F | Ht 68.0 in | Wt 189.0 lb

## 2017-07-13 DIAGNOSIS — Z0000 Encounter for general adult medical examination without abnormal findings: Secondary | ICD-10-CM

## 2017-07-13 DIAGNOSIS — I10 Essential (primary) hypertension: Secondary | ICD-10-CM | POA: Diagnosis not present

## 2017-07-13 DIAGNOSIS — Z8546 Personal history of malignant neoplasm of prostate: Secondary | ICD-10-CM | POA: Diagnosis not present

## 2017-07-13 DIAGNOSIS — R7303 Prediabetes: Secondary | ICD-10-CM

## 2017-07-13 NOTE — Patient Instructions (Signed)
   The CDC recommends two doses of Shingrix (the shingles vaccine) separated by 2 to 6 months for adults age 82 years and older. I recommend checking with your pharmacy plan regarding coverage for this vaccine.     

## 2017-07-13 NOTE — Patient Instructions (Addendum)
[name] , Thank you for taking time to come for your Medicare Wellness Visit. I appreciate your ongoing commitment to your health goals. Please review the following plan we discussed and let me know if I can assist you in the future.   Screening recommendations/referrals: Colonoscopy: N/A Recommended yearly ophthalmology/optometry visit for glaucoma screening and checkup Recommended yearly dental visit for hygiene and checkup  Vaccinations: Influenza vaccine: Up to date Pneumococcal vaccine: Up to date Tdap vaccine: Up to date Shingles vaccine: Pt declines today.     Advanced directives: Please bring a copy of your POA (Power of Attorney) and/or Living Will to your next appointment.   Conditions/risks identified: Recommend increasing water intake to 4 glasses a day.   Next appointment: 10:00 AM today with Dr Mark Hunter.  Preventive Care 82 Years and Older, Male Preventive care refers to lifestyle choices and visits with your health care provider that can promote health and wellness. What does preventive care include?  A yearly physical exam. This is also called an annual well check.  Dental exams once or twice a year.  Routine eye exams. Ask your health care provider how often you should have your eyes checked.  Personal lifestyle choices, including:  Daily care of your teeth and gums.  Regular physical activity.  Eating a healthy diet.  Avoiding tobacco and drug use.  Limiting alcohol use.  Practicing safe sex.  Taking low doses of aspirin every day.  Taking vitamin and mineral supplements as recommended by your health care provider. What happens during an annual well check? The services and screenings done by your health care provider during your annual well check will depend on your age, overall health, lifestyle risk factors, and family history of disease. Counseling  Your health care provider may ask you questions about your:  Alcohol use.  Tobacco  use.  Drug use.  Emotional well-being.  Home and relationship well-being.  Sexual activity.  Eating habits.  History of falls.  Memory and ability to understand (cognition).  Work and work Statistician. Screening  You may have the following tests or measurements:  Height, weight, and BMI.  Blood pressure.  Lipid and cholesterol levels. These may be checked every 5 years, or more frequently if you are over 82 years old.  Skin check.  Lung cancer screening. You may have this screening every year starting at age 30 if you have a 30-pack-year history of smoking and currently smoke or have quit within the past 15 years.  Fecal occult blood test (FOBT) of the stool. You may have this test every year starting at age 82.  Flexible sigmoidoscopy or colonoscopy. You may have a sigmoidoscopy every 5 years or a colonoscopy every 10 years starting at age 85.  Prostate cancer screening. Recommendations will vary depending on your family history and other risks.  Hepatitis C blood test.  Hepatitis B blood test.  Sexually transmitted disease (STD) testing.  Diabetes screening. This is done by checking your blood sugar (glucose) after you have not eaten for a while (fasting). You may have this done every 1-3 years.  Abdominal aortic aneurysm (AAA) screening. You may need this if you are a current or former smoker.  Osteoporosis. You may be screened starting at age 23 if you are at high risk. Talk with your health care provider about your test results, treatment options, and if necessary, the need for more tests. Vaccines  Your health care provider may recommend certain vaccines, such as:  Influenza vaccine. This  is recommended every year.  Tetanus, diphtheria, and acellular pertussis (Tdap, Td) vaccine. You may need a Td booster every 10 years.  Zoster vaccine. You may need this after age 32.  Pneumococcal 13-valent conjugate (PCV13) vaccine. One dose is recommended after age  82.  Pneumococcal polysaccharide (PPSV23) vaccine. One dose is recommended after age 15. Talk to your health care provider about which screenings and vaccines you need and how often you need them. This information is not intended to replace advice given to you by your health care provider. Make sure you discuss any questions you have with your health care provider. Document Released: 04/11/2015 Document Revised: 12/03/2015 Document Reviewed: 01/14/2015 Elsevier Interactive Patient Education  2017 [place] Prevention in the Home Falls can cause injuries. They can happen to people of all ages. There are many things you can do to make your home safe and to help prevent falls. What can I do on the outside of my home?  Regularly fix the edges of walkways and driveways and fix any cracks.  Remove anything that might make you trip as you walk through a door, such as a raised step or threshold.  Trim any bushes or trees on the path to your home.  Use bright outdoor lighting.  Clear any walking paths of anything that might make someone trip, such as rocks or tools.  Regularly check to see if handrails are loose or broken. Make sure that both sides of any steps have handrails.  Any raised decks and porches should have guardrails on the edges.  Have any leaves, snow, or ice cleared regularly.  Use sand or salt on walking paths during winter.  Clean up any spills in your garage right away. This includes oil or grease spills. What can I do in the bathroom?  Use night lights.  Install grab bars by the toilet and in the tub and shower. Do not use towel bars as grab bars.  Use non-skid mats or decals in the tub or shower.  If you need to sit down in the shower, use a plastic, non-slip stool.  Keep the floor dry. Clean up any water that spills on the floor as soon as it happens.  Remove soap buildup in the tub or shower regularly.  Attach bath mats securely with double-sided  non-slip rug tape.  Do not have throw rugs and other things on the floor that can make you trip. What can I do in the bedroom?  Use night lights.  Make sure that you have a light by your bed that is easy to reach.  Do not use any sheets or blankets that are too big for your bed. They should not hang down onto the floor.  Have a firm chair that has side arms. You can use this for support while you get dressed.  Do not have throw rugs and other things on the floor that can make you trip. What can I do in the kitchen?  Clean up any spills right away.  Avoid walking on wet floors.  Keep items that you use a lot in easy-to-reach places.  If you need to reach something above you, use a strong step stool that has a grab bar.  Keep electrical cords out of the way.  Do not use floor polish or wax that makes floors slippery. If you must use wax, use non-skid floor wax.  Do not have throw rugs and other things on the floor that can make you  trip. What can I do with my stairs?  Do not leave any items on the stairs.  Make sure that there are handrails on both sides of the stairs and use them. Fix handrails that are broken or loose. Make sure that handrails are as long as the stairways.  Check any carpeting to make sure that it is firmly attached to the stairs. Fix any carpet that is loose or worn.  Avoid having throw rugs at the top or bottom of the stairs. If you do have throw rugs, attach them to the floor with carpet tape.  Make sure that you have a light switch at the top of the stairs and the bottom of the stairs. If you do not have them, ask someone to add them for you. What else can I do to help prevent falls?  Wear shoes that:  Do not have high heels.  Have rubber bottoms.  Are comfortable and fit you well.  Are closed at the toe. Do not wear sandals.  If you use a stepladder:  Make sure that it is fully opened. Do not climb a closed stepladder.  Make sure that both  sides of the stepladder are locked into place.  Ask someone to hold it for you, if possible.  Clearly mark and make sure that you can see:  Any grab bars or handrails.  First and last steps.  Where the edge of each step is.  Use tools that help you move around (mobility aids) if they are needed. These include:  Canes.  Walkers.  Scooters.  Crutches.  Turn on the lights when you go into a dark area. Replace any light bulbs as soon as they burn out.  Set up your furniture so you have a clear path. Avoid moving your furniture around.  If any of your floors are uneven, fix them.  If there are any pets around you, be aware of where they are.  Review your medicines with your doctor. Some medicines can make you feel dizzy. This can increase your chance of falling. Ask your doctor what other things that you can do to help prevent falls. This information is not intended to replace advice given to you by your health care provider. Make sure you discuss any questions you have with your health care provider. Document Released: 01/09/2009 Document Revised: 08/21/2015 Document Reviewed: 04/19/2014 Elsevier Interactive Patient Education  2017 [employer].

## 2017-07-13 NOTE — Progress Notes (Signed)
Patient: Mark Hunter, Male    DOB: 11-02-1931, 84 y.o.   MRN: [id number] Visit Date: 07/13/2017  Today's Provider: Lelon Huh, MD   No chief complaint on file.  Subjective:   Patient saw [facility] for AWV today at 9:20 am.   Complete Physical Mark Hunter is a 84 y.o. male. He feels well. He reports exercising no regular exercise. He reports he is sleeping well.  -----------------------------------------------------------   Hypertension, follow-up:  BP Readings from Last 3 Encounters:  06/23/16 132/64  06/23/16 (!) 140/58  04/29/16 (!) 142/72    He was last seen for hypertension 1 years ago.  BP at that visit was 132/64. Management since that visit includes; no changes.He reports good compliance with treatment. He is not having side effects. none He some exercising. He is adherent to low salt diet.   Outside blood pressures are 140/60. He is experiencing none.  Patient denies none.   Cardiovascular risk factors include advanced age (older than 62 for men, 28 for women).  Use of agents associated with hypertension: none ------------------------------------------------------------  Pre-diabetes From 06/23/2016-no changes. Doing well with diet. Encouraged to exercise more.    Review of Systems  Constitutional: Negative for chills, diaphoresis and fever.  HENT: Negative for congestion, ear discharge, ear pain, hearing loss, nosebleeds, sore throat and tinnitus.   Eyes: Negative for photophobia, pain, discharge and redness.  Respiratory: Negative for cough, shortness of breath, wheezing and stridor.   Cardiovascular: Negative for chest pain, palpitations and leg swelling.  Gastrointestinal: Negative for abdominal pain, blood in stool, constipation, diarrhea, nausea and vomiting.  Endocrine: Negative for polydipsia.  Genitourinary: Negative for dysuria, flank pain, frequency, hematuria and urgency.  Musculoskeletal: Negative for back pain, myalgias and neck  pain.  Skin: Negative for rash.  Allergic/Immunologic: Negative for environmental allergies.  Neurological: Negative for dizziness, tremors, seizures, weakness and headaches.  Hematological: Does not bruise/bleed easily.  Psychiatric/Behavioral: Negative for hallucinations and suicidal ideas. The patient is not nervous/anxious.   All other systems reviewed and are negative.   Social History   Socioeconomic History  . Marital status: Married    Spouse name: Not on file  . Number of children: 3  . Years of education: Not on file  . Highest education level: Not on file  Occupational History  . Occupation: Retired  Scientific laboratory technician  . Financial resource strain: Not on file  . Food insecurity:    Worry: Not on file    Inability: Not on file  . Transportation needs:    Medical: Not on file    Non-medical: Not on file  Tobacco Use  . Smoking status: Never Smoker  . Smokeless tobacco: Never Used  Substance and Sexual Activity  . Alcohol use: Yes    Alcohol/week: 4.2 oz    Types: 7 Cans of beer per week  . Drug use: No  . Sexual activity: Not on file  Lifestyle  . Physical activity:    Days per week: Not on file    Minutes per session: Not on file  . Stress: Not on file  Relationships  . Social connections:    Talks on phone: Not on file    Gets together: Not on file    Attends religious service: Not on file    Active member of club or organization: Not on file    Attends meetings of clubs or organizations: Not on file    Relationship status: Not on file  .  Intimate partner violence:    Fear of current or ex partner: Not on file    Emotionally abused: Not on file    Physically abused: Not on file    Forced sexual activity: Not on file  Other Topics Concern  . Not on file  Social History Narrative  . Not on file    Past Medical History:  Diagnosis Date  . BPH (benign prostatic hyperplasia)   . Cancer [facility]) 2003   prostate  . Gout   . History of measles   . History  of mumps   . Hypertension      Patient Active Problem List   Diagnosis Date Noted  . History of squamous cell carcinoma 04/29/2016  . Pain in shoulder 07/09/2015  . Low back pain 07/09/2015  . Pre-diabetes 01/20/2009  . Essential (primary) hypertension 01/07/2006  . Diverticulosis of colon without hemorrhage 08/07/2001  . Personal history of prostate cancer 03/29/2001  . Gouty arthropathy 03/29/1998    Past Surgical History:  Procedure Laterality Date  . CATARACT EXTRACTION W/PHACO Right 12/16/2014   Procedure: CATARACT EXTRACTION PHACO AND INTRAOCULAR LENS PLACEMENT ([place]);  Surgeon: Estill Cotta, MD;  Location: [facility] ORS;  Service: Ophthalmology;  Laterality: Right;  [place]   1:12   . CATARACT EXTRACTION W/PHACO Left 04/21/2016   Procedure: CATARACT EXTRACTION PHACO AND INTRAOCULAR LENS PLACEMENT ([facility]);  Surgeon: Estill Cotta, MD;  Location: [facility] ORS;  Service: Ophthalmology;  Laterality: Left;  [place] 01:27 AP% 21.8 CDE 34.78 Fluid pack lot # [id number]  . COLONOSCOPY  04/11/2007   Dr. Donnella Sham, [facility]. Hyperplastic polyps; radiation colitis, multiple colonic angioectasias  . LEG / ANKLE SOFT TISSUE BIOPSY    . PROSTATE SURGERY  02/2002   seed implant  . SHOULDER ARTHROSCOPY W/ ROTATOR CUFF REPAIR Right 05/03/2010    His family history includes Cancer in his brother; Stroke in his father. There is no history of Diabetes or Heart disease.      Current Outpatient Medications:  .  amLODipine (NORVASC) 10 MG tablet, TAKE 1 TABLET (10 MG TOTAL) BY MOUTH DAILY., Disp: 90 tablet, Rfl: 4 .  aspirin 81 MG tablet, Take 81 mg by mouth daily., Disp: , Rfl:  .  metoprolol succinate (TOPROL-XL) 50 MG 24 hr tablet, TAKE 1 TABLET BY MOUTH EVERY DAY, Disp: 90 tablet, Rfl: 3 .  Multiple Vitamin (MULTI-VITAMINS PO), Take 1 tablet by mouth once., Disp: , Rfl:  .  Omega-3 Fatty Acids (FISH OIL PO), Take 1 capsule by mouth once., Disp: , Rfl:  .  tamsulosin (FLOMAX) 0.4 MG CAPS capsule, Take 1  capsule (0.4 mg total) by mouth daily., Disp: 90 capsule, Rfl: 4  Patient Care Team: Birdie Sons, MD as PCP - General (Family Medicine) Dingeldein, Remo Lipps, MD as Consulting Physician (Ophthalmology) Jannet Mantis, MD as Consulting Physician (Dermatology)     Objective:   Vitals:   BP 138/48 (BP Location: Left Arm)    Pulse 72    Temp 99 F (37.2 C) (Oral)    Ht 5\' 8"  (1.727 m)    Wt 189 lb (85.7 kg)    BMI 28.74 kg/m    BSA 2.03 m    Physical Exam   General Appearance:    Alert, cooperative, no distress, appears stated age  Head:    Normocephalic, without obvious abnormality, atraumatic  Eyes:    PERRL, conjunctiva/corneas clear, EOM's intact, fundi    benign, both eyes       Ears:  Normal TM's and external ear canals, both ears  Nose:   Nares normal, septum midline, mucosa normal, no drainage   or sinus tenderness  Throat:   Lips, mucosa, and tongue normal; teeth and gums normal  Neck:   Supple, symmetrical, trachea midline, no adenopathy;       thyroid:  No enlargement/tenderness/nodules; no carotid   bruit or JVD  Back:     Symmetric, no curvature, ROM normal, no CVA tenderness  Lungs:     Clear to auscultation bilaterally, respirations unlabored  Chest wall:    No tenderness or deformity  Heart:    Regular rate and rhythm, S1 and S2 normal, no murmur, rub   or gallop  Abdomen:     Soft, non-tender, bowel sounds active all four quadrants,    no masses, no organomegaly  Genitalia:    deferred  Rectal:    deferred  Extremities:   Extremities normal, atraumatic, no cyanosis or edema  Pulses:   2+ and symmetric all extremities  Skin:   Skin color, texture, turgor normal, no rashes or lesions  Lymph nodes:   Cervical, supraclavicular, and axillary nodes normal  Neurologic:   CNII-XII intact. Normal strength, sensation and reflexes      throughout    Activities of Daily Living No flowsheet data found.  Fall Risk Assessment Fall Risk   06/23/2016 07/17/2015  Falls in the past year? No No     Depression Screen PHQ 2/9 Scores 06/23/2016 06/23/2016 07/17/2015  PHQ - 2 Score 0 0 0  PHQ- 9 Score 0 - 0      Assessment & Plan:    Annual Physical Reviewed patient's Family Medical History Reviewed and updated list of patient's medical providers Assessment of cognitive impairment was done Assessed patient's functional ability Established a written schedule for health screening [place] Completed and Reviewed  Exercise Activities and Dietary recommendations Goals    None      Immunization History  Administered Date(s) Administered  . H1N1 02/08/2008  . Influenza Split 12/22/2009  . Pneumococcal Conjugate-13 04/23/2014  . Pneumococcal Polysaccharide-23 01/22/2004  . Tdap 07/17/2015  . Zoster 04/13/2006    Health Maintenance  Topic Date Due  . INFLUENZA VACCINE  10/27/2017  . TETANUS/TDAP  07/16/2025  . PNA vac Low Risk Adult  Completed     Discussed health benefits of physical activity, and encouraged him to engage in regular exercise appropriate for his age and condition.    ---------------------------------------------------------------------  1. Annual physical exam Doing well, Counseled regarding prudent diet and regular exercise.   - Lipid panel - Comprehensive metabolic panel - Hemoglobin A1c - EKG 12-Lead - PSA  2. Essential (primary) hypertension  - Lipid panel - Comprehensive metabolic panel - Hemoglobin A1c - EKG 12-Lead  3. Pre-diabetes  - Hemoglobin A1c  4. Personal history of prostate cancer  - PSA   Lelon Huh, MD  [facility]

## 2017-07-13 NOTE — Progress Notes (Addendum)
Subjective:   Mark Hunter is a 84 y.o. male who presents for Medicare Annual/Subsequent preventive examination.  Review of Systems:  N/A  Cardiac Risk Factors include: advanced age (>42men, >45 women);male gender;hypertension     Objective:    Vitals: BP (!) 138/48 (BP Location: Left Arm)   Pulse 72   Temp 99 F (37.2 C) (Oral)   Ht 5\' 8"  (1.727 m)   Wt 189 lb (85.7 kg)   BMI 28.74 kg/m   Body mass index is 28.74 kg/m.  Advanced Directives 07/13/2017 06/23/2016 04/21/2016 12/16/2014  Does Patient Have a Medical Advance Directive? Yes Yes Yes (No Data)  Type of Paramedic of [place];Living will Living will;Healthcare Power of [place];Living will -  Does patient want to make changes to medical advance directive? - - No - Patient declined -  Copy of JAARS in Chart? No - copy requested No - copy requested No - copy requested -    Tobacco Social History   Tobacco Use  Smoking Status Never Smoker  Smokeless Tobacco Never Used     Counseling given: Not Answered   Clinical Intake:  Pre-visit preparation completed: Yes  Pain : No/denies pain Pain Score: 0-No pain     Nutritional Status: BMI 25 -29 Overweight Nutritional Risks: None Diabetes: No  How often do you need to have someone help you when you read instructions, pamphlets, or other written materials from your doctor or pharmacy?: 1 - Never  Interpreter Needed?: No  Information entered by :: [facility], LPN  Past Medical History:  Diagnosis Date  . BPH (benign prostatic hyperplasia)   . Cancer [facility]) 2003   prostate  . Cancer ([place]) 12/15/2015   [facility]  . Gout   . History of measles   . History of mumps   . Hypertension    Past Surgical History:  Procedure Laterality Date  . CATARACT EXTRACTION W/PHACO Right 12/16/2014   Procedure: CATARACT EXTRACTION PHACO AND INTRAOCULAR LENS PLACEMENT ([place]);  Surgeon: Estill Cotta,  MD;  Location: [facility] ORS;  Service: Ophthalmology;  Laterality: Right;  [place]   1:12   . CATARACT EXTRACTION W/PHACO Left 04/21/2016   Procedure: CATARACT EXTRACTION PHACO AND INTRAOCULAR LENS PLACEMENT ([facility]);  Surgeon: Estill Cotta, MD;  Location: [facility] ORS;  Service: Ophthalmology;  Laterality: Left;  [place] 01:27 AP% 21.8 CDE 34.78 Fluid pack lot # [id number]  . COLONOSCOPY  04/11/2007   Dr. Donnella Sham, [facility]. Hyperplastic polyps; radiation colitis, multiple colonic angioectasias  . LEG / ANKLE SOFT TISSUE BIOPSY    . PROSTATE SURGERY  02/2002   seed implant  . SHOULDER ARTHROSCOPY W/ ROTATOR CUFF REPAIR Right 05/03/2010  . SQUAMOUS CELL CARCINOMA EXCISION  12/14/2016   on right cheek   Family History  Problem Relation Age of Onset  . Stroke Father   . Cancer Brother        of eye, removed eyeball  . Diabetes Neg Hx   . Heart disease Neg Hx    Social History   Socioeconomic History  . Marital status: Married    Spouse name: Not on file  . Number of children: 3  . Years of education: Not on file  . Highest education level: Some college, no degree  Occupational History  . Occupation: Retired  Scientific laboratory technician  . Financial resource strain: Not hard at all  . Food insecurity:    Worry: Never true    Inability: Never true  .  Transportation needs:    Medical: No    Non-medical: No  Tobacco Use  . Smoking status: Never Smoker  . Smokeless tobacco: Never Used  Substance and Sexual Activity  . Alcohol use: Yes    Alcohol/week: 4.2 - 8.4 oz    Types: 7 - 14 Cans of beer per week  . Drug use: No  . Sexual activity: Not on file  Lifestyle  . Physical activity:    Days per week: Not on file    Minutes per session: Not on file  . Stress: Not at all  Relationships  . Social connections:    Talks on phone: Not on file    Gets together: Not on file    Attends religious service: Not on file    Active member of club or organization: Not on file    Attends meetings of clubs or  organizations: Not on file    Relationship status: Not on file  Other Topics Concern  . Not on file  Social History Narrative  . Not on file    Outpatient Encounter Medications as of 07/13/2017  Medication Sig  . amLODipine (NORVASC) 10 MG tablet TAKE 1 TABLET (10 MG TOTAL) BY MOUTH DAILY.  Marland Kitchen aspirin 81 MG tablet Take 81 mg by mouth daily.  Marland Kitchen glucosamine-chondroitin 500-400 MG tablet Take 1 tablet by mouth daily.  . metoprolol succinate (TOPROL-XL) 50 MG 24 hr tablet TAKE 1 TABLET BY MOUTH EVERY DAY  . Multiple Vitamin (MULTI-VITAMINS PO) Take 1 tablet by mouth once.  . Omega-3 Fatty Acids (FISH OIL PO) Take 1 capsule by mouth once.  . tamsulosin (FLOMAX) 0.4 MG CAPS capsule Take 1 capsule (0.4 mg total) by mouth daily.   No facility-administered encounter medications on file as of 07/13/2017.     Activities of Daily Living In your present state of health, do you have any difficulty performing the following activities: 07/13/2017  Hearing? N  Vision? N  Difficulty concentrating or making decisions? N  Walking or climbing stairs? N  Dressing or bathing? N  Doing errands, shopping? N  Preparing Food and eating ? N  Using the Toilet? N  In the past six months, have you accidently leaked urine? N  Do you have problems with loss of bowel control? N  Managing your Medications? N  Managing your Finances? N  Housekeeping or managing your Housekeeping? N  Some recent data might be hidden    Patient Care Team: Birdie Sons, MD as PCP - General (Family Medicine) Dingeldein, Remo Lipps, MD as Consulting Physician (Ophthalmology) Jannet Mantis, MD as Consulting Physician (Dermatology)   Assessment:   This is a routine wellness examination for Mark Hunter.  Exercise Activities and Dietary recommendations Current Exercise Habits: The patient does not participate in regular exercise at present, Exercise limited by: None identified  Goals    . DIET - INCREASE WATER INTAKE      Recommend increasing water intake to 4 glasses a day.        Fall Risk Fall Risk  07/13/2017 06/23/2016 07/17/2015  Falls in the past year? No No No   Is the patient's home free of loose throw rugs in walkways, pet beds, electrical cords, etc?   yes      Grab bars in the bathroom? no      Handrails on the stairs?   yes      Adequate lighting?   yes  Timed Get Up and Go Performed: N/A  Depression Screen PHQ  2/9 Scores 07/13/2017 07/13/2017 06/23/2016 06/23/2016  PHQ - 2 Score 0 0 0 0  PHQ- 9 Score 0 - 0 -    Cognitive Function:      6CIT Screen 07/13/2017 06/23/2016  What Year? 0 points 0 points  What month? 0 points 0 points  What time? 0 points 0 points  Count back from 20 0 points 0 points  Months in reverse 0 points 0 points  Repeat phrase 2 points 0 points  Total Score 2 0    Immunization History  Administered Date(s) Administered  . H1N1 02/08/2008  . Influenza Split 12/22/2009  . Influenza, High Dose Seasonal PF 01/17/2017  . Pneumococcal Conjugate-13 04/23/2014  . Pneumococcal Polysaccharide-23 01/22/2004  . Tdap 07/17/2015  . Zoster 04/13/2006    Qualifies for Shingles Vaccine? Due for Shingles vaccine. Declined my offer to administer today. Education has been provided regarding the importance of this vaccine. Pt has been advised to call her insurance company to determine her out of pocket expense. Advised she may also receive this vaccine at her local pharmacy or Health Dept. Verbalized acceptance and understanding.  Screening Tests Health Maintenance  Topic Date Due  . INFLUENZA VACCINE  10/27/2017  . TETANUS/TDAP  07/16/2025  . PNA vac Low Risk Adult  Completed   Cancer Screenings: Lung: Low Dose CT Chest recommended if Age 37-80 years, 30 pack-year currently smoking OR have quit w/in 15years. Patient does not qualify. Colorectal: N/A  Additional Screenings:  Hepatitis C Screening: N/A      Plan:  I have personally reviewed and addressed the Medicare  Annual Wellness questionnaire and have noted the following in the patient's chart:  A. Medical and social history B. Use of alcohol, tobacco or illicit drugs  C. Current medications and supplements D. Functional ability and status E.  Nutritional status F.  Physical activity G. Advance directives H. List of other physicians I.  Hospitalizations, surgeries, and ER visits in previous 12 months J.  [place] such as hearing and vision if needed, cognitive and depression L. Referrals and appointments - none  In addition, I have reviewed and discussed with patient certain preventive protocols, quality metrics, and best practice recommendations. A written personalized care plan for preventive services as well as general preventive health recommendations were provided to patient.  See attached scanned questionnaire for additional information.   Signed,  Fabio Neighbors, LPN Nurse Health Advisor   Nurse Recommendations: None.

## 2017-07-18 DIAGNOSIS — Z8546 Personal history of malignant neoplasm of prostate: Secondary | ICD-10-CM | POA: Diagnosis not present

## 2017-07-18 DIAGNOSIS — I10 Essential (primary) hypertension: Secondary | ICD-10-CM | POA: Diagnosis not present

## 2017-07-18 DIAGNOSIS — Z0000 Encounter for general adult medical examination without abnormal findings: Secondary | ICD-10-CM | POA: Diagnosis not present

## 2017-07-18 DIAGNOSIS — R7303 Prediabetes: Secondary | ICD-10-CM | POA: Diagnosis not present

## 2017-07-19 LAB — HEMOGLOBIN A1C
ESTIMATED AVERAGE GLUCOSE: 126 mg/dL
HEMOGLOBIN A1C: 6 % — AB (ref 4.8–5.6)

## 2017-07-19 LAB — COMPREHENSIVE METABOLIC PANEL
ALT: 15 IU/L (ref 0–44)
AST: 19 IU/L (ref 0–40)
Albumin/Globulin Ratio: 2.1 (ref 1.2–2.2)
Albumin: 4.6 g/dL (ref 3.5–4.7)
Alkaline Phosphatase: 46 IU/L (ref 39–117)
BUN/Creatinine Ratio: 12 (ref 10–24)
BUN: 12 mg/dL (ref 8–27)
Bilirubin Total: 0.6 mg/dL (ref 0.0–1.2)
CALCIUM: 9.4 mg/dL (ref 8.6–10.2)
CO2: 23 mmol/L (ref 20–29)
CREATININE: 0.98 mg/dL (ref 0.76–1.27)
Chloride: 105 mmol/L (ref 96–106)
GFR calc Af Amer: 82 mL/min/{1.73_m2} (ref >59)
GFR calc non Af Amer: 71 mL/min/{1.73_m2} (ref >59)
GLUCOSE: 116 mg/dL — AB (ref 65–99)
Globulin, Total: 2.2 g/dL (ref 1.5–4.5)
Potassium: 4.3 mmol/L (ref 3.5–5.2)
Sodium: 143 mmol/L (ref 134–144)
Total Protein: 6.8 g/dL (ref 6.0–8.5)

## 2017-07-19 LAB — LIPID PANEL
Chol/HDL Ratio: 4.4 ratio (ref 0.0–5.0)
Cholesterol, Total: 170 mg/dL (ref 100–199)
HDL: 39 mg/dL — ABNORMAL LOW (ref >39)
LDL CALC: 96 mg/dL (ref 0–99)
Triglycerides: 174 mg/dL — ABNORMAL HIGH (ref 0–149)
VLDL Cholesterol Cal: 35 mg/dL (ref 5–40)

## 2017-07-19 LAB — PSA: PROSTATE SPECIFIC AG, SERUM: 0.1 ng/mL (ref 0.0–4.0)

## 2017-07-20 ENCOUNTER — Telehealth: Payer: Self-pay | Admitting: *Deleted

## 2017-07-20 NOTE — Telephone Encounter (Signed)
-----   Message from Birdie Sons, MD sent at 07/19/2017  7:44 AM EDT ----- Blood sugar, kidney functions, electrolytes and cholesterol are all normal. Follow up for BP and blood sugar check in 6 months.

## 2017-07-20 NOTE — Telephone Encounter (Signed)
LMOVM for pt to return call 

## 2017-07-26 NOTE — Telephone Encounter (Signed)
Viewed by Rosita Fire on 07/20/2017 3:47 PM

## 2017-09-11 ENCOUNTER — Other Ambulatory Visit: Payer: Self-pay | Admitting: Family Medicine

## 2017-09-19 ENCOUNTER — Other Ambulatory Visit: Payer: Self-pay | Admitting: Family Medicine

## 2017-10-13 NOTE — Progress Notes (Signed)
Patient: Mark Hunter Male    DOB: 13-May-1931   84 y.o.   MRN: [id number] Visit Date: 10/14/2017  Today's Provider: Lelon Huh, MD   Chief Complaint  Patient presents with  . Ear Pain    x 1 week   Subjective:    Otalgia   There is pain in the right (d) ear. This is a new problem. The current episode started in the past 7 days. The problem occurs constantly. There has been no fever. Associated symptoms include ear discharge, headaches and rhinorrhea. Pertinent negatives include no abdominal pain, coughing or vomiting. He has tried nothing for the symptoms.  Patient states his left ear was bothering him a week ago, but has since then improved. He says the left ear feels stuffy.      No Known Allergies   Current Outpatient Medications:  .  amLODipine (NORVASC) 10 MG tablet, TAKE 1 TABLET (10 MG TOTAL) BY MOUTH DAILY., Disp: 90 tablet, Rfl: 4 .  aspirin 81 MG tablet, Take 81 mg by mouth daily., Disp: , Rfl:  .  glucosamine-chondroitin 500-400 MG tablet, Take 1 tablet by mouth daily., Disp: , Rfl:  .  metoprolol succinate (TOPROL-XL) 50 MG 24 hr tablet, TAKE 1 TABLET BY MOUTH EVERY DAY, Disp: 90 tablet, Rfl: 3 .  Multiple Vitamin (MULTI-VITAMINS PO), Take 1 tablet by mouth once., Disp: , Rfl:  .  Omega-3 Fatty Acids (FISH OIL PO), Take 1 capsule by mouth once., Disp: , Rfl:  .  tamsulosin (FLOMAX) 0.4 MG CAPS capsule, TAKE 1 CAPSULE (0.4 MG TOTAL) BY MOUTH DAILY., Disp: 90 capsule, Rfl: 4  Review of Systems  Constitutional: Negative for appetite change, chills, diaphoresis, fatigue and fever.  HENT: Positive for ear discharge, ear pain and rhinorrhea.   Respiratory: Negative for cough, chest tightness, shortness of breath and wheezing.   Cardiovascular: Negative for chest pain and palpitations.  Gastrointestinal: Positive for nausea. Negative for abdominal pain and vomiting.  Neurological: Positive for headaches.    Social History   Tobacco Use  . Smoking status:  Never Smoker  . Smokeless tobacco: Never Used  Substance Use Topics  . Alcohol use: Yes    Alcohol/week: 4.2 - 8.4 oz    Types: 7 - 14 Cans of beer per week   Objective:   BP (!) 142/64 (BP Location: Left Arm, Patient Position: Sitting, Cuff Size: Large)   Pulse 64   Temp 98.5 F (36.9 C) (Oral)   Resp 16   Wt 190 lb (86.2 kg)   SpO2 97% Comment: room air  BMI 28.89 kg/m  Vitals:   10/14/17 1137  BP: (!) 142/64  Pulse: 64  Resp: 16  Temp: 98.5 F (36.9 C)  TempSrc: Oral  SpO2: 97%  Weight: 190 lb (86.2 kg)     Physical Exam  General Appearance:    Alert, cooperative, no distress  HENT:   Right ear canal inflamed and mildly swollen.   Eyes:    PERRL, conjunctiva/corneas clear, EOM's intact               Assessment & Plan:     1. Acute otitis externa of right ear, unspecified type  - neomycin-polymyxin-hydrocortisone (CORTISPORIN) OTIC solution; Place 3 drops into the right ear 4 (four) times daily for 7 days.  Dispense: 10 mL; Refill: 0 - cephALEXin (KEFLEX) 500 MG capsule; Take 1 capsule (500 mg total) by mouth 4 (four) times daily for 7 days.  Dispense:  28 capsule; Refill: 0  Call if symptoms change or if not rapidly improving.          Lelon Huh, MD  [facility]

## 2017-10-14 ENCOUNTER — Ambulatory Visit (INDEPENDENT_AMBULATORY_CARE_PROVIDER_SITE_OTHER): Payer: Medicare HMO | Admitting: Family Medicine

## 2017-10-14 ENCOUNTER — Encounter: Payer: Self-pay | Admitting: Family Medicine

## 2017-10-14 VITALS — BP 142/64 | HR 64 | Temp 98.5°F | Resp 16 | Wt 190.0 lb

## 2017-10-14 DIAGNOSIS — H60501 Unspecified acute noninfective otitis externa, right ear: Secondary | ICD-10-CM | POA: Diagnosis not present

## 2017-10-14 MED ORDER — NEOMYCIN-POLYMYXIN-HC 3.5-10000-1 OT SOLN
3.0000 [drp] | Freq: Four times a day (QID) | OTIC | 0 refills | Status: AC
Start: 2017-10-14 — End: 2017-10-21

## 2017-10-14 MED ORDER — CEPHALEXIN 500 MG PO CAPS
500.0000 mg | ORAL_CAPSULE | Freq: Four times a day (QID) | ORAL | 0 refills | Status: AC
Start: 2017-10-14 — End: 2017-10-21

## 2017-12-22 DIAGNOSIS — R69 Illness, unspecified: Secondary | ICD-10-CM | POA: Diagnosis not present

## 2018-01-23 ENCOUNTER — Ambulatory Visit (INDEPENDENT_AMBULATORY_CARE_PROVIDER_SITE_OTHER): Payer: Medicare HMO | Admitting: Family Medicine

## 2018-01-23 ENCOUNTER — Encounter: Payer: Self-pay | Admitting: Family Medicine

## 2018-01-23 VITALS — BP 146/62 | HR 71 | Temp 98.7°F | Resp 16 | Ht 68.0 in | Wt 192.0 lb

## 2018-01-23 DIAGNOSIS — R7303 Prediabetes: Secondary | ICD-10-CM

## 2018-01-23 DIAGNOSIS — I10 Essential (primary) hypertension: Secondary | ICD-10-CM | POA: Diagnosis not present

## 2018-01-23 LAB — POCT GLYCOSYLATED HEMOGLOBIN (HGB A1C)
Est. average glucose Bld gHb Est-mCnc: 128
HEMOGLOBIN A1C: 6.1 % — AB (ref 4.0–5.6)

## 2018-01-23 MED ORDER — HYDROCHLOROTHIAZIDE 25 MG PO TABS: 25.0000 mg | ORAL_TABLET | Freq: Every day | ORAL | 0 refills | Status: DC

## 2018-01-23 NOTE — Progress Notes (Signed)
Patient: Mark Hunter Male    DOB: [birth year]   84 y.o.   MRN: [id number] Visit Date: 01/23/2018  Today's Provider: Lelon Huh, MD   Chief Complaint  Patient presents with  . Follow-up   Subjective:    HPI  Prediabetes, Follow-up:   Lab Results  Component Value Date   HGBA1C 6.0 (H) 07/18/2017   HGBA1C 5.9 04/29/2016   GLUCOSE 116 (H) 07/18/2017   GLUCOSE 104 (H) 04/29/2016   GLUCOSE 133 (H) 07/21/2015    Last seen for for this6 months ago.  Management since that visit includes no changes. Current symptoms include none and have been stable.   Weight trend: stable Prior visit with dietician: no Current diet: well balanced Current exercise: none  Pertinent Labs:    Component Value Date/Time   CHOL 170 07/18/2017 0808   TRIG 174 (H) 07/18/2017 0808   CHOLHDL 4.4 07/18/2017 0808   CREATININE 0.98 07/18/2017 0808    Wt Readings from Last 3 Encounters:  01/23/18 192 lb (87.1 kg)  10/14/17 190 lb (86.2 kg)  07/13/17 189 lb (85.7 kg)    Hypertension, follow-up:  BP Readings from Last 3 Encounters:  01/23/18 (!) 146/62  10/14/17 (!) 142/64  07/13/17 (!) 138/48    He was last seen for hypertension 6 months ago.  BP at that visit was 138/48. Management since that visit includes no changes. He reports good compliance with treatment. He is not having side effects.  He is not exercising. He is adherent to low salt diet.   Outside blood pressures are mostly in the 140s to 150s. Marland KitchenHe is experiencing none.  Patient denies chest pain, chest pressure/discomfort, claudication, dyspnea, exertional chest pressure/discomfort, fatigue, irregular heart beat, lower extremity edema, near-syncope, orthopnea, palpitations, paroxysmal nocturnal dyspnea, syncope and tachypnea.   Cardiovascular risk factors include advanced age (older than 72 for men, 40 for women), hypertension and male gender.  Use of agents associated with hypertension: NSAIDS.     Weight  trend: stable Wt Readings from Last 3 Encounters:  01/23/18 192 lb (87.1 kg)  10/14/17 190 lb (86.2 kg)  07/13/17 189 lb (85.7 kg)    Current diet: well balanced  ------------------------------------------------------------------------     No Known Allergies   Current Outpatient Medications:  .  amLODipine (NORVASC) 10 MG tablet, TAKE 1 TABLET (10 MG TOTAL) BY MOUTH DAILY., Disp: 90 tablet, Rfl: 4 .  aspirin 81 MG tablet, Take 81 mg by mouth daily., Disp: , Rfl:  .  glucosamine-chondroitin 500-400 MG tablet, Take 1 tablet by mouth daily., Disp: , Rfl:  .  metoprolol succinate (TOPROL-XL) 50 MG 24 hr tablet, TAKE 1 TABLET BY MOUTH EVERY DAY, Disp: 90 tablet, Rfl: 3 .  Multiple Vitamin (MULTI-VITAMINS PO), Take 1 tablet by mouth once., Disp: , Rfl:  .  Omega-3 Fatty Acids (FISH OIL PO), Take 1 capsule by mouth once., Disp: , Rfl:  .  tamsulosin (FLOMAX) 0.4 MG CAPS capsule, TAKE 1 CAPSULE (0.4 MG TOTAL) BY MOUTH DAILY., Disp: 90 capsule, Rfl: 4  Review of Systems  Constitutional: Negative for appetite change, chills and fever.  Respiratory: Negative for chest tightness, shortness of breath and wheezing.   Cardiovascular: Negative for chest pain and palpitations.  Gastrointestinal: Negative for abdominal pain, nausea and vomiting.  Neurological:       Tingling in feet    Social History   Tobacco Use  . Smoking status: Never Smoker  . Smokeless tobacco: Never Used  Substance Use Topics  . Alcohol use: Yes    Alcohol/week: 7.0 - 14.0 standard drinks    Types: 7 - 14 Cans of beer per week   Objective:   BP (!) 146/62 (BP Location: Left Arm, Patient Position: Sitting, Cuff Size: Large)   Pulse 71   Temp 98.7 F (37.1 C) (Oral)   Resp 16   Ht 5\' 8"  (1.727 m)   Wt 192 lb (87.1 kg)   SpO2 97% Comment: room air  BMI 29.19 kg/m     Physical Exam   General Appearance:    Alert, cooperative, no distress  Eyes:    PERRL, conjunctiva/corneas clear, EOM's intact         Lungs:     Clear to auscultation bilaterally, respirations unlabored  Heart:    Regular rate and rhythm  Neurologic:   Awake, alert, oriented x 3. No apparent focal neurological           defect.       Results for orders placed or performed in visit on 01/23/18  POCT HgB A1C  Result Value Ref Range   Hemoglobin A1C 6.1 (A) 4.0 - 5.6 %   HbA1c POC (<> result, manual entry)     HbA1c, POC (prediabetic range)     Est. average glucose Bld gHb Est-mCnc 128        Assessment & Plan:     1. Pre-diabetes  - POCT HgB A1C  2. Essential (primary) hypertension Systolics have been consistently elevated. Will add hctz 25 and recheck BP in 2-3 months.         Lelon Huh, MD  [facility]

## 2018-02-15 DIAGNOSIS — Z961 Presence of intraocular lens: Secondary | ICD-10-CM | POA: Diagnosis not present

## 2018-04-12 ENCOUNTER — Ambulatory Visit (INDEPENDENT_AMBULATORY_CARE_PROVIDER_SITE_OTHER): Payer: Medicare HMO | Admitting: Family Medicine

## 2018-04-12 ENCOUNTER — Encounter: Payer: Self-pay | Admitting: Family Medicine

## 2018-04-12 VITALS — BP 130/60 | HR 68 | Temp 98.5°F | Resp 16 | Ht 68.0 in | Wt 185.0 lb

## 2018-04-12 DIAGNOSIS — I10 Essential (primary) hypertension: Secondary | ICD-10-CM | POA: Diagnosis not present

## 2018-04-12 NOTE — Patient Instructions (Signed)
.   Please bring all of your medications to every appointment so we can make sure that our medication list is the same as yours.   

## 2018-04-12 NOTE — Progress Notes (Signed)
Patient: Mark Hunter Male    DOB: 08/24/1931   85 y.o.   MRN: [id number] Visit Date: 04/12/2018  Today's Provider: Lelon Huh, MD   Chief Complaint  Patient presents with  . Hypertension   Subjective:     HPI    Hypertension, follow-up:  BP Readings from Last 3 Encounters:  04/12/18 136/64  01/23/18 (!) 146/62  10/14/17 (!) 142/64    He was last seen for hypertension 2 months ago.  BP at that visit was 146/62. Management since that visit includes Added HCTZ 25mg  daily. He reports excellent compliance with treatment. He is not having side effects.  He is not exercising. He is adherent to low salt diet.   Outside blood pressures are 140's/70's. He is experiencing none.  Patient denies chest pain, fatigue, irregular heart beat and lower extremity edema.   Cardiovascular risk factors include advanced age (older than 23 for men, 43 for women), hypertension, male gender and obesity (BMI >= 30 kg/m2).  Use of agents associated with hypertension: none.     Weight trend: stable Wt Readings from Last 3 Encounters:  04/12/18 185 lb (83.9 kg)  01/23/18 192 lb (87.1 kg)  10/14/17 190 lb (86.2 kg)    Current diet: in general, a "healthy" diet    ------------------------------------------------------------------------    No Known Allergies   Current Outpatient Medications:  .  amLODipine (NORVASC) 10 MG tablet, TAKE 1 TABLET (10 MG TOTAL) BY MOUTH DAILY., Disp: 90 tablet, Rfl: 4 .  aspirin 81 MG tablet, Take 81 mg by mouth daily., Disp: , Rfl:  .  glucosamine-chondroitin 500-400 MG tablet, Take 1 tablet by mouth daily., Disp: , Rfl:  .  hydrochlorothiazide (HYDRODIURIL) 25 MG tablet, Take 1 tablet (25 mg total) by mouth daily., Disp: 90 tablet, Rfl: 0 .  metoprolol succinate (TOPROL-XL) 50 MG 24 hr tablet, TAKE 1 TABLET BY MOUTH EVERY DAY, Disp: 90 tablet, Rfl: 3 .  Multiple Vitamin (MULTI-VITAMINS PO), Take 1 tablet by mouth once., Disp: , Rfl:  .   Omega-3 Fatty Acids (FISH OIL PO), Take 1 capsule by mouth once., Disp: , Rfl:  .  tamsulosin (FLOMAX) 0.4 MG CAPS capsule, TAKE 1 CAPSULE (0.4 MG TOTAL) BY MOUTH DAILY., Disp: 90 capsule, Rfl: 4  Review of Systems  Constitutional: Negative.   HENT: Positive for sore throat. Negative for trouble swallowing.   Respiratory: Positive for shortness of breath. Negative for apnea, cough, choking, chest tightness, wheezing and stridor.   Cardiovascular: Negative.   Gastrointestinal: Negative.   Neurological: Positive for light-headedness and numbness (Feet are numb). Negative for dizziness and headaches.    Social History   Tobacco Use  . Smoking status: Never Smoker  . Smokeless tobacco: Never Used  Substance Use Topics  . Alcohol use: Yes    Alcohol/week: 7.0 - 14.0 standard drinks    Types: 7 - 14 Cans of beer per week      Objective:    Vitals:   04/12/18 0939 04/12/18 1007  BP: 136/64 130/60  Pulse: 68   Resp: 16   Temp: 98.5 F (36.9 C)   TempSrc: Oral   Weight: 185 lb (83.9 kg)   Height: 5\' 8"  (1.727 m)    Body mass index is 28.13 kg/m.   Physical Exam   General Appearance:    Alert, cooperative, no distress  Eyes:    PERRL, conjunctiva/corneas clear, EOM's intact       Lungs:  Clear to auscultation bilaterally, respirations unlabored  Heart:    Regular rate and rhythm  Neurologic:   Awake, alert, oriented x 3. No apparent focal neurological           defect.          Assessment & Plan    1. Essential (primary) hypertension Much better with addition of hctz.  - Renal function panel     Lelon Huh, MD  [facility]

## 2018-04-13 ENCOUNTER — Telehealth: Payer: Self-pay

## 2018-04-13 LAB — RENAL FUNCTION PANEL
Albumin: 4.5 g/dL (ref 3.5–4.7)
BUN / CREAT RATIO: 12 (ref 10–24)
BUN: 12 mg/dL (ref 8–27)
CO2: 24 mmol/L (ref 20–29)
CREATININE: 1.03 mg/dL (ref 0.76–1.27)
Calcium: 9.5 mg/dL (ref 8.6–10.2)
Chloride: 97 mmol/L (ref 96–106)
GFR calc Af Amer: 77 mL/min/{1.73_m2} (ref >59)
GFR, EST NON AFRICAN AMERICAN: 66 mL/min/{1.73_m2} (ref >59)
Glucose: 151 mg/dL — ABNORMAL HIGH (ref 65–99)
Phosphorus: 2.9 mg/dL (ref 2.8–4.1)
Potassium: 3.4 mmol/L — ABNORMAL LOW (ref 3.5–5.2)
SODIUM: 140 mmol/L (ref 134–144)

## 2018-04-13 NOTE — Telephone Encounter (Signed)
Pt advised. Apt made for 10:40 on 07/17/18  Thanks,   -Mickel Baas

## 2018-04-13 NOTE — Telephone Encounter (Signed)
-----   Message from Birdie Sons, MD sent at 04/13/2018  8:09 AM EST ----- Potassium is slightly low, but not enough to need any potassium supplements. Just try to get more potassium in diet such as bananas, sweet potatoes, green leafy vegies or avocados. Continue current medications.    Patient has AWV scheduled on 07-17-2018. But also needs follow up diabetes around that time. Can schedule on same day If available.

## 2018-04-18 ENCOUNTER — Other Ambulatory Visit: Payer: Self-pay | Admitting: Family Medicine

## 2018-04-18 DIAGNOSIS — I10 Essential (primary) hypertension: Secondary | ICD-10-CM

## 2018-07-05 ENCOUNTER — Telehealth: Payer: Self-pay

## 2018-07-05 NOTE — Telephone Encounter (Signed)
Called pt to inquire about completing a virtual visit instead of an in office visit due to the Covid-19 restrictions. Pt declined a virtual visit and declined r/s the AWV for the summer. Pt would like like a CB at a later date when the restrictions have been lifted to schedule his AWV.  Pt agreed to keep his OV on 07/17/18 unless otherwise told not to by PCP. Juluis Rainier!  -MM

## 2018-07-05 NOTE — Telephone Encounter (Signed)
He can postpone follow up visit to the last week of may

## 2018-07-05 NOTE — Telephone Encounter (Signed)
LM advising pt of PCPs advise. Requested a CB to r/s f/u apt.  -MM

## 2018-07-17 ENCOUNTER — Ambulatory Visit: Payer: Medicare HMO | Admitting: Family Medicine

## 2018-07-17 ENCOUNTER — Ambulatory Visit: Payer: Self-pay

## 2018-08-03 ENCOUNTER — Ambulatory Visit: Payer: Self-pay | Admitting: Family Medicine

## 2018-08-11 ENCOUNTER — Ambulatory Visit: Payer: Self-pay | Admitting: Family Medicine

## 2018-09-04 ENCOUNTER — Other Ambulatory Visit: Payer: Self-pay | Admitting: Family Medicine

## 2018-09-17 ENCOUNTER — Other Ambulatory Visit: Payer: Self-pay | Admitting: Family Medicine

## 2018-11-16 NOTE — Progress Notes (Signed)
Subjective:   Mark Hunter is a 85 y.o. male who presents for Medicare Annual/Subsequent preventive examination.    This visit is being conducted through telemedicine due to the COVID-19 pandemic. This patient has given me verbal consent via doximity to conduct this visit, patient states they are participating from their home address. Some vital signs may be absent or patient reported.    Patient identification: identified by name, DOB, and current address  Review of Systems:  N/A  Cardiac Risk Factors include: advanced age (>59men, >64 women)     Objective:    Vitals: There were no vitals taken for this visit.  There is no height or weight on file to calculate BMI. Unable to obtain vitals due to visit being conducted via telephonically.   Advanced Directives 11/20/2018 07/13/2017 06/23/2016 04/21/2016 12/16/2014  Does Patient Have a Medical Advance Directive? Yes Yes Yes Yes (No Data)  Type of Paramedic of [place];Living will [place];Living will Living will;Healthcare Power of [place];Living will -  Does patient want to make changes to medical advance directive? - - - No - Patient declined -  Copy of [place] in Chart? No - copy requested No - copy requested No - copy requested No - copy requested -    Tobacco Social History   Tobacco Use  Smoking Status Never Smoker  Smokeless Tobacco Never Used     Counseling given: Not Answered   Clinical Intake:  Pre-visit preparation completed: Yes  Pain : No/denies pain Pain Score: 0-No pain     Nutritional Risks: None Diabetes: No  How often do you need to have someone help you when you read instructions, pamphlets, or other written materials from your doctor or pharmacy?: 1 - Never  Interpreter Needed?: No  Information entered by :: [facility], LPN  Past Medical History:  Diagnosis Date  . BPH (benign prostatic  hyperplasia)   . Cancer [facility]) 2003   prostate  . Cancer ([place]) 12/15/2015   SCC  . Gout   . History of measles   . History of mumps   . Hypertension    Past Surgical History:  Procedure Laterality Date  . CATARACT EXTRACTION W/PHACO Right 12/16/2014   Procedure: CATARACT EXTRACTION PHACO AND INTRAOCULAR LENS PLACEMENT ([place]);  Surgeon: Estill Cotta, MD;  Location: [facility] ORS;  Service: Ophthalmology;  Laterality: Right;  [place]   1:12   . CATARACT EXTRACTION W/PHACO Left 04/21/2016   Procedure: CATARACT EXTRACTION PHACO AND INTRAOCULAR LENS PLACEMENT ([facility]);  Surgeon: Estill Cotta, MD;  Location: [facility] ORS;  Service: Ophthalmology;  Laterality: Left;  [place] 01:27 AP% 21.8 CDE 34.78 Fluid pack lot # [id number]  . COLONOSCOPY  04/11/2007   Dr. Donnella Sham, [facility]. Hyperplastic polyps; radiation colitis, multiple colonic angioectasias  . LEG / ANKLE SOFT TISSUE BIOPSY    . PROSTATE SURGERY  02/2002   seed implant  . SHOULDER ARTHROSCOPY W/ ROTATOR CUFF REPAIR Right 05/03/2010  . SQUAMOUS CELL CARCINOMA EXCISION  12/14/2016   on right cheek   Family History  Problem Relation Age of Onset  . Stroke Father   . Cancer Brother        of eye, removed eyeball  . Diabetes Neg Hx   . Heart disease Neg Hx    Social History   Socioeconomic History  . Marital status: Married    Spouse name: Not on file  . Number of children: 3  . Years of education:  Not on file  . Highest education level: Some college, no degree  Occupational History  . Occupation: Retired  Scientific laboratory technician  . Financial resource strain: Not hard at all  . Food insecurity    Worry: Never true    Inability: Never true  . Transportation needs    Medical: No    Non-medical: No  Tobacco Use  . Smoking status: Never Smoker  . Smokeless tobacco: Never Used  Substance and Sexual Activity  . Alcohol use: Yes    Alcohol/week: 7.0 - 14.0 standard drinks    Types: 7 - 14 Cans of beer per week  . Drug use: No  . Sexual activity:  Not on file  Lifestyle  . Physical activity    Days per week: 0 days    Minutes per session: 0 min  . Stress: Not at all  Relationships  . Social Herbalist on phone: Patient refused    Gets together: Patient refused    Attends religious service: Patient refused    Active member of club or organization: Patient refused    Attends meetings of clubs or organizations: Patient refused    Relationship status: Patient refused  Other Topics Concern  . Not on file  Social History Narrative  . Not on file    Outpatient Encounter Medications as of 11/20/2018  Medication Sig  . amLODipine (NORVASC) 10 MG tablet TAKE 1 TABLET (10 MG TOTAL) BY MOUTH DAILY.  Marland Kitchen aspirin 81 MG tablet Take 81 mg by mouth daily.  . hydrochlorothiazide (HYDRODIURIL) 25 MG tablet Take 1 tablet (25 mg total) by mouth daily.  . metoprolol succinate (TOPROL-XL) 50 MG 24 hr tablet TAKE 1 TABLET BY MOUTH EVERY DAY  . Multiple Vitamin (MULTI-VITAMINS PO) Take 1 tablet by mouth once.  . Omega-3 Fatty Acids (FISH OIL PO) Take 1 capsule by mouth once. 4000 mg  . Propylene Glycol-Glycerin (SOOTHE) 0.6-0.6 % SOLN Place 3-4 drops into both eyes daily. Soothe  . tamsulosin (FLOMAX) 0.4 MG CAPS capsule TAKE 1 CAPSULE (0.4 MG TOTAL) BY MOUTH DAILY.  Marland Kitchen glucosamine-chondroitin 500-400 MG tablet Take 1 tablet by mouth daily.   No facility-administered encounter medications on file as of 11/20/2018.     Activities of Daily Living In your present state of health, do you have any difficulty performing the following activities: 11/20/2018  Hearing? N  Vision? N  Difficulty concentrating or making decisions? N  Walking or climbing stairs? N  Dressing or bathing? N  Doing errands, shopping? N  Preparing Food and eating ? N  Using the Toilet? N  In the past six months, have you accidently leaked urine? N  Do you have problems with loss of bowel control? N  Managing your Medications? N  Managing your Finances? N   Housekeeping or managing your Housekeeping? N  Some recent data might be hidden    Patient Care Team: Birdie Sons, MD as PCP - General (Family Medicine) Dingeldein, Remo Lipps, MD as Consulting Physician (Ophthalmology) Jannet Mantis, MD as Consulting Physician (Dermatology)   Assessment:   This is a routine wellness examination for Mark Hunter.  Exercise Activities and Dietary recommendations Current Exercise Habits: Home exercise routine, Type of exercise: walking, Time (Minutes): 35, Frequency (Times/Week): 3, Weekly Exercise (Minutes/Week): 105, Intensity: Mild, Exercise limited by: None identified  Goals    . DIET - INCREASE WATER INTAKE     Recommend increasing water intake to 4 glasses a day.     Marland Kitchen  Exercise      Pt to increase exercise to walking 1 hour every day.       Fall Risk: Fall Risk  11/20/2018 07/13/2017 06/23/2016 07/17/2015  Falls in the past year? 0 No No No    FALL RISK PREVENTION PERTAINING TO THE HOME:  Any stairs in or around the home? Yes  If so, are there any without handrails? No   Home free of loose throw rugs in walkways, pet beds, electrical cords, etc? Yes  Adequate lighting in your home to reduce risk of falls? Yes   ASSISTIVE DEVICES UTILIZED TO PREVENT FALLS:  Life alert? No  Use of a cane, walker or w/c? No  Grab bars in the bathroom? No  Shower chair or bench in shower? No  Elevated toilet seat or a handicapped toilet? No   TIMED UP AND GO:  Was the test performed? No .    Depression Screen PHQ 2/9 Scores 11/20/2018 07/13/2017 07/13/2017 06/23/2016  PHQ - 2 Score 0 0 0 0  PHQ- 9 Score - 0 - 0    Cognitive Function     6CIT Screen 11/20/2018 07/13/2017 06/23/2016  What Year? 0 points 0 points 0 points  What month? 0 points 0 points 0 points  What time? 0 points 0 points 0 points  Count back from 20 0 points 0 points 0 points  Months in reverse 0 points 0 points 0 points  Repeat phrase 0 points 2 points 0 points  Total Score  0 2 0    Immunization History  Administered Date(s) Administered  . H1N1 02/08/2008  . Influenza Split 12/22/2009  . Influenza, High Dose Seasonal PF 01/17/2017, 12/22/2017  . Influenza-Unspecified 12/24/2017  . Pneumococcal Conjugate-13 04/23/2014  . Pneumococcal Polysaccharide-23 01/22/2004  . Tdap 07/17/2015  . Zoster 04/13/2006    Qualifies for Shingles Vaccine? Yes  Zostavax completed 04/13/06. Due for Shingrix. Education has been provided regarding the importance of this vaccine. Pt has been advised to call insurance company to determine out of pocket expense. Advised may also receive vaccine at local pharmacy or Health Dept. Verbalized acceptance and understanding.  Tdap: Up to date  Flu Vaccine: Due fall 2020  Pneumococcal Vaccine: Completed series  Screening Tests Health Maintenance  Topic Date Due  . INFLUENZA VACCINE  10/28/2018  . TETANUS/TDAP  07/16/2025  . PNA vac Low Risk Adult  Completed   Cancer Screenings:  Colorectal Screening: No longer required.   Lung Cancer Screening: (Low Dose CT Chest recommended if Age 37-80 years, 30 pack-year currently smoking OR have quit w/in 15years.) does not qualify.   Additional Screening:  Vision Screening: Recommended annual ophthalmology exams for early detection of glaucoma and other disorders of the eye.  Dental Screening: Recommended annual dental exams for proper oral hygiene  Community Resource Referral:  CRR required this visit?  No        Plan:  I have personally reviewed and addressed the Medicare Annual Wellness questionnaire and have noted the following in the patient's chart:  A. Medical and social history B. Use of alcohol, tobacco or illicit drugs  C. Current medications and supplements D. Functional ability and status E.  Nutritional status F.  Physical activity G. Advance directives H. List of other physicians I.  Hospitalizations, surgeries, and ER visits in previous 12 months J.  [place] such as hearing and vision if needed, cognitive and depression L. Referrals and appointments   In addition, I have reviewed and discussed  with patient certain preventive protocols, quality metrics, and best practice recommendations. A written personalized care plan for preventive services as well as general preventive health recommendations were provided to patient.   Glendora Score, LPN  05/24/7503 Nurse Health Advisor   Nurse Notes: None.

## 2018-11-20 ENCOUNTER — Ambulatory Visit (INDEPENDENT_AMBULATORY_CARE_PROVIDER_SITE_OTHER): Payer: Medicare HMO

## 2018-11-20 ENCOUNTER — Other Ambulatory Visit: Payer: Self-pay

## 2018-11-20 DIAGNOSIS — Z0000 Encounter for general adult medical examination without abnormal findings: Secondary | ICD-10-CM

## 2018-11-20 NOTE — Patient Instructions (Addendum)
Mark Hunter , Thank you for taking time to come for your Medicare Wellness Visit. I appreciate your ongoing commitment to your health goals. Please review the following plan we discussed and let me know if I can assist you in the future.   Screening recommendations/referrals: Colonoscopy: No longer required.  Recommended yearly ophthalmology/optometry visit for glaucoma screening and checkup Recommended yearly dental visit for hygiene and checkup  Vaccinations: Influenza vaccine: Currently due Pneumococcal vaccine: Completed series Tdap vaccine: Up to date, due 06/2025 Shingles vaccine: Pt declines today.     Advanced directives: Please bring a copy of your POA (Power of Attorney) and/or Living Will to your next appointment.   Conditions/risks identified: Continue to increase water intake to 6-8 8 oz glasses a day.   Next appointment: 12/26/27 for a flu shot. Pt to schedule a CPE at that apt. Declined scheduling an AWV for 2021 at this time.   Preventive Care 32 Years and Older, Male Preventive care refers to lifestyle choices and visits with your health care provider that can promote health and wellness. What does preventive care include?  A yearly physical exam. This is also called an annual well check.  Dental exams once or twice a year.  Routine eye exams. Ask your health care provider how often you should have your eyes checked.  Personal lifestyle choices, including:  Daily care of your teeth and gums.  Regular physical activity.  Eating a healthy diet.  Avoiding tobacco and drug use.  Limiting alcohol use.  Practicing safe sex.  Taking low doses of aspirin every day.  Taking vitamin and mineral supplements as recommended by your health care provider. What happens during an annual well check? The services and screenings done by your health care provider during your annual well check will depend on your age, overall health, lifestyle risk factors, and family history  of disease. Counseling  Your health care provider may ask you questions about your:  Alcohol use.  Tobacco use.  Drug use.  Emotional well-being.  Home and relationship well-being.  Sexual activity.  Eating habits.  History of falls.  Memory and ability to understand (cognition).  Work and work Statistician. Screening  You may have the following tests or measurements:  Height, weight, and BMI.  Blood pressure.  Lipid and cholesterol levels. These may be checked every 5 years, or more frequently if you are over 14 years old.  Skin check.  Lung cancer screening. You may have this screening every year starting at age 29 if you have a 30-pack-year history of smoking and currently smoke or have quit within the past 15 years.  Fecal occult blood test (FOBT) of the stool. You may have this test every year starting at age 33.  Flexible sigmoidoscopy or colonoscopy. You may have a sigmoidoscopy every 5 years or a colonoscopy every 10 years starting at age 25.  Prostate cancer screening. Recommendations will vary depending on your family history and other risks.  Hepatitis C blood test.  Hepatitis B blood test.  Sexually transmitted disease (STD) testing.  Diabetes screening. This is done by checking your blood sugar (glucose) after you have not eaten for a while (fasting). You may have this done every 1-3 years.  Abdominal aortic aneurysm (AAA) screening. You may need this if you are a current or former smoker.  Osteoporosis. You may be screened starting at age 20 if you are at high risk. Talk with your health care provider about your test results, treatment options, and  if necessary, the need for more tests. Vaccines  Your health care provider may recommend certain vaccines, such as:  Influenza vaccine. This is recommended every year.  Tetanus, diphtheria, and acellular pertussis (Tdap, Td) vaccine. You may need a Td booster every 10 years.  Zoster vaccine. You may  need this after age 97.  Pneumococcal 13-valent conjugate (PCV13) vaccine. One dose is recommended after age 39.  Pneumococcal polysaccharide (PPSV23) vaccine. One dose is recommended after age 75. Talk to your health care provider about which screenings and vaccines you need and how often you need them. This information is not intended to replace advice given to you by your health care provider. Make sure you discuss any questions you have with your health care provider. Document Released: 04/11/2015 Document Revised: 12/03/2015 Document Reviewed: 01/14/2015 Elsevier Interactive Patient Education  2017 [place] Prevention in the Home Falls can cause injuries. They can happen to people of all ages. There are many things you can do to make your home safe and to help prevent falls. What can I do on the outside of my home?  Regularly fix the edges of walkways and driveways and fix any cracks.  Remove anything that might make you trip as you walk through a door, such as a raised step or threshold.  Trim any bushes or trees on the path to your home.  Use bright outdoor lighting.  Clear any walking paths of anything that might make someone trip, such as rocks or tools.  Regularly check to see if handrails are loose or broken. Make sure that both sides of any steps have handrails.  Any raised decks and porches should have guardrails on the edges.  Have any leaves, snow, or ice cleared regularly.  Use sand or salt on walking paths during winter.  Clean up any spills in your garage right away. This includes oil or grease spills. What can I do in the bathroom?  Use night lights.  Install grab bars by the toilet and in the tub and shower. Do not use towel bars as grab bars.  Use non-skid mats or decals in the tub or shower.  If you need to sit down in the shower, use a plastic, non-slip stool.  Keep the floor dry. Clean up any water that spills on the floor as soon as it  happens.  Remove soap buildup in the tub or shower regularly.  Attach bath mats securely with double-sided non-slip rug tape.  Do not have throw rugs and other things on the floor that can make you trip. What can I do in the bedroom?  Use night lights.  Make sure that you have a light by your bed that is easy to reach.  Do not use any sheets or blankets that are too big for your bed. They should not hang down onto the floor.  Have a firm chair that has side arms. You can use this for support while you get dressed.  Do not have throw rugs and other things on the floor that can make you trip. What can I do in the kitchen?  Clean up any spills right away.  Avoid walking on wet floors.  Keep items that you use a lot in easy-to-reach places.  If you need to reach something above you, use a strong step stool that has a grab bar.  Keep electrical cords out of the way.  Do not use floor polish or wax that makes floors slippery. If you  must use wax, use non-skid floor wax.  Do not have throw rugs and other things on the floor that can make you trip. What can I do with my stairs?  Do not leave any items on the stairs.  Make sure that there are handrails on both sides of the stairs and use them. Fix handrails that are broken or loose. Make sure that handrails are as long as the stairways.  Check any carpeting to make sure that it is firmly attached to the stairs. Fix any carpet that is loose or worn.  Avoid having throw rugs at the top or bottom of the stairs. If you do have throw rugs, attach them to the floor with carpet tape.  Make sure that you have a light switch at the top of the stairs and the bottom of the stairs. If you do not have them, ask someone to add them for you. What else can I do to help prevent falls?  Wear shoes that:  Do not have high heels.  Have rubber bottoms.  Are comfortable and fit you well.  Are closed at the toe. Do not wear sandals.  If you  use a stepladder:  Make sure that it is fully opened. Do not climb a closed stepladder.  Make sure that both sides of the stepladder are locked into place.  Ask someone to hold it for you, if possible.  Clearly mark and make sure that you can see:  Any grab bars or handrails.  First and last steps.  Where the edge of each step is.  Use tools that help you move around (mobility aids) if they are needed. These include:  Canes.  Walkers.  Scooters.  Crutches.  Turn on the lights when you go into a dark area. Replace any light bulbs as soon as they burn out.  Set up your furniture so you have a clear path. Avoid moving your furniture around.  If any of your floors are uneven, fix them.  If there are any pets around you, be aware of where they are.  Review your medicines with your doctor. Some medicines can make you feel dizzy. This can increase your chance of falling. Ask your doctor what other things that you can do to help prevent falls. This information is not intended to replace advice given to you by your health care provider. Make sure you discuss any questions you have with your health care provider. Document Released: 01/09/2009 Document Revised: 08/21/2015 Document Reviewed: 04/19/2014 Elsevier Interactive Patient Education  2017 [employer].

## 2018-11-30 ENCOUNTER — Other Ambulatory Visit: Payer: Self-pay | Admitting: Family Medicine

## 2018-12-07 ENCOUNTER — Telehealth: Payer: Self-pay

## 2018-12-07 ENCOUNTER — Telehealth: Payer: Self-pay | Admitting: Family Medicine

## 2018-12-07 ENCOUNTER — Telehealth (INDEPENDENT_AMBULATORY_CARE_PROVIDER_SITE_OTHER): Payer: Medicare HMO | Admitting: Physician Assistant

## 2018-12-07 DIAGNOSIS — M109 Gout, unspecified: Secondary | ICD-10-CM

## 2018-12-07 MED ORDER — INDOMETHACIN ER 75 MG PO CPCR: 75.0000 mg | ORAL_CAPSULE | Freq: Two times a day (BID) | ORAL | 0 refills | Status: DC

## 2018-12-07 MED ORDER — INDOMETHACIN 50 MG PO CAPS: 50.0000 mg | ORAL_CAPSULE | Freq: Two times a day (BID) | ORAL | 0 refills | Status: DC

## 2018-12-07 NOTE — Telephone Encounter (Signed)
Dr. Caryn Section is out today. Please review.

## 2018-12-07 NOTE — Telephone Encounter (Signed)
Patient called saying that he took indomethacin for a gout flare around 10am and he is feeling lightheaded. He reports that he took his BP meds (amlodpine and HCTZ) around 8am.   He is feeling a little better, but he feels that his BP is running a little low. He has not checked it. He wants to know should he take the metoprolol this evening? Please advise.  Pt normally sees Dr. Caryn Section. Thanks!

## 2018-12-07 NOTE — Telephone Encounter (Signed)
Apt scheduled.   Thanks,   -Mickel Baas

## 2018-12-07 NOTE — Patient Instructions (Signed)
Gout  Gout is painful swelling of your joints. Gout is a type of arthritis. It is caused by having too much uric acid in your body. Uric acid is a chemical that is made when your body breaks down substances called purines. If your body has too much uric acid, sharp crystals can form and build up in your joints. This causes pain and swelling. Gout attacks can happen quickly and be very painful (acute gout). Over time, the attacks can affect more joints and happen more often (chronic gout). What are the causes?  Too much uric acid in your blood. This can happen because: ? Your kidneys do not remove enough uric acid from your blood. ? Your body makes too much uric acid. ? You eat too many foods that are high in purines. These foods include organ meats, some seafood, and beer.  Trauma or stress. What increases the risk?  Having a family history of gout.  Being male and middle-aged.  Being male and having gone through menopause.  Being very overweight (obese).  Drinking alcohol, especially beer.  Not having enough water in the body (being dehydrated).  Losing weight too quickly.  Having an organ transplant.  Having lead poisoning.  Taking certain medicines.  Having kidney disease.  Having a skin condition called psoriasis. What are the signs or symptoms? An attack of acute gout usually happens in just one joint. The most common place is the big toe. Attacks often start at night. Other joints that may be affected include joints of the feet, ankle, knee, fingers, wrist, or elbow. Symptoms of an attack may include:  Very bad pain.  Warmth.  Swelling.  Stiffness.  Shiny, red, or purple skin.  Tenderness. The affected joint may be very painful to touch.  Chills and fever. Chronic gout may cause symptoms more often. More joints may be involved. You may also have white or yellow lumps (tophi) on your hands or feet or in other areas near your joints. How is this  treated?  Treatment for this condition has two phases: treating an acute attack and preventing future attacks.  Acute gout treatment may include: ? NSAIDs. ? Steroids. These are taken by mouth or injected into a joint. ? Colchicine. This medicine relieves pain and swelling. It can be given by mouth or through an IV tube.  Preventive treatment may include: ? Taking small doses of NSAIDs or colchicine daily. ? Using a medicine that reduces uric acid levels in your blood. ? Making changes to your diet. You may need to see a food expert (dietitian) about what to eat and drink to prevent gout. Follow these instructions at home: During a gout attack   If told, put ice on the painful area: ? Put ice in a plastic bag. ? Place a towel between your skin and the bag. ? Leave the ice on for 20 minutes, 2-3 times a day.  Raise (elevate) the painful joint above the level of your heart as often as you can.  Rest the joint as much as possible. If the joint is in your leg, you may be given crutches.  Follow instructions from your doctor about what you cannot eat or drink. Avoiding future gout attacks  Eat a low-purine diet. Avoid foods and drinks such as: ? Liver. ? Kidney. ? Anchovies. ? Asparagus. ? Herring. ? Mushrooms. ? Mussels. ? Beer.  Stay at a healthy weight. If you want to lose weight, talk with your doctor. Do not lose weight   too fast.  Start or continue an exercise plan as told by your doctor. Eating and drinking  Drink enough fluids to keep your pee (urine) pale yellow.  If you drink alcohol: ? Limit how much you use to:  0-1 drink a day for women.  0-2 drinks a day for men. ? Be aware of how much alcohol is in your drink. In the [place] one drink equals one 12 oz bottle of beer (355 mL), one 5 oz glass of wine (148 mL), or one 1 oz glass of hard liquor (44 mL). General instructions  Take over-the-counter and prescription medicines only as told by your doctor.  Do  not drive or use heavy machinery while taking prescription pain medicine.  Return to your normal activities as told by your doctor. Ask your doctor what activities are safe for you.  Keep all follow-up visits as told by your doctor. This is important. Contact a doctor if:  You have another gout attack.  You still have symptoms of a gout attack after 10 days of treatment.  You have problems (side effects) because of your medicines.  You have chills or a fever.  You have burning pain when you pee (urinate).  You have pain in your lower back or belly. Get help right away if:  You have very bad pain.  Your pain cannot be controlled.  You cannot pee. Summary  Gout is painful swelling of the joints.  The most common site of pain is the big toe, but it can affect other joints.  Medicines and avoiding some foods can help to prevent and treat gout attacks. This information is not intended to replace advice given to you by your health care provider. Make sure you discuss any questions you have with your health care provider. Document Released: 12/23/2007 Document Revised: 10/05/2017 Document Reviewed: 10/05/2017 Elsevier Patient Education  2020 Elsevier Inc.  

## 2018-12-07 NOTE — Telephone Encounter (Signed)
indomethacin 50 mg capsule for occasional gout. Pt is said it's 2017 old Rx. Pt having another flare up needing a refill at:   [id number] - [place], [place] - [address] [phone] (Phone) [phone] (Fax)   Thanks, TGh

## 2018-12-07 NOTE — Progress Notes (Signed)
Subjective:    Patient ID: Mark Hunter, male    DOB: 1932-01-26, 85 y.o.   MRN: [id number]  Mark Hunter is a 85 y.o. male presenting on 12/07/2018 for Gout  Virtual Visit via Video Note  I connected with Rosita Fire on 12/07/18 at  4:20 PM EDT by a video enabled telemedicine application and verified that I am speaking with the correct person using two identifiers.   I discussed the limitations of evaluation and management by telemedicine and the availability of in person appointments. The patient expressed understanding and agreed to proceed.  Patient location: home Provider location: [place] office  Persons involved in the visit: patient, provider   HPI   Patient with history of gout on thiazide diuretic presents today for flare of gout in his right ankle. He reports his ankle was red and swollen. He took an indomethacin today but noticed it was last prescribed in 2017. He usually gets gout in his toe but sometimes it occurs in his ankle. He reports it clears up with indomethacin. He denies history of bleeding ulcers. Denies recent injuries to the ankle, warmth, fevers, or chills. He does not have diabetes.   Social History   Tobacco Use  . Smoking status: Never Smoker  . Smokeless tobacco: Never Used  Substance Use Topics  . Alcohol use: Yes    Alcohol/week: 7.0 - 14.0 standard drinks    Types: 7 - 14 Cans of beer per week  . Drug use: No    Review of Systems Per HPI unless specifically indicated above     Objective:    There were no vitals taken for this visit.  Wt Readings from Last 3 Encounters:  04/12/18 185 lb (83.9 kg)  01/23/18 192 lb (87.1 kg)  10/14/17 190 lb (86.2 kg)    Physical Exam Constitutional:      Appearance: Normal appearance.  Musculoskeletal:       Feet:  Skin:    General: Skin is warm and dry.  Neurological:     Mental Status: He is alert and oriented to person, place, and time. Mental status is at  baseline.  Psychiatric:        Mood and Affect: Mood normal.        Behavior: Behavior normal.    Results for orders placed or performed in visit on 04/12/18  Renal function panel  Result Value Ref Range   Glucose 151 (H) 65 - 99 mg/dL   BUN 12 8 - 27 mg/dL   Creatinine, Ser 1.03 0.76 - 1.27 mg/dL   GFR calc non Af Amer 66 >59 mL/min/1.73   GFR calc Af Amer 77 >59 mL/min/1.73   BUN/Creatinine Ratio 12 10 - 24   Sodium 140 134 - 144 mmol/L   Potassium 3.4 (L) 3.5 - 5.2 mmol/L   Chloride 97 96 - 106 mmol/L   CO2 24 20 - 29 mmol/L   Calcium 9.5 8.6 - 10.2 mg/dL   Phosphorus 2.9 2.8 - 4.1 mg/dL   Albumin 4.5 3.5 - 4.7 g/dL      Assessment & Plan:  1. Gouty arthropathy  Will treat as below. Take sparingly with some food, counseled about GI risk.   - indomethacin (INDOCIN) 50 MG capsule; Take 1 capsule (50 mg total) by mouth 2 (two) times daily with a meal for 15 days.  Dispense: 30 capsule; Refill: 0    Follow up plan: Return if symptoms worsen or fail to improve.  Carles Collet, PA-C [facility] 12/07/2018, 4:46 PM

## 2018-12-07 NOTE — Telephone Encounter (Signed)
Hold if still light headed.

## 2018-12-07 NOTE — Telephone Encounter (Signed)
Please do video visit he can be on my 4:20.

## 2018-12-20 ENCOUNTER — Other Ambulatory Visit: Payer: Self-pay | Admitting: Physician Assistant

## 2018-12-20 DIAGNOSIS — M109 Gout, unspecified: Secondary | ICD-10-CM

## 2018-12-26 ENCOUNTER — Ambulatory Visit (INDEPENDENT_AMBULATORY_CARE_PROVIDER_SITE_OTHER): Payer: Medicare HMO

## 2018-12-26 ENCOUNTER — Other Ambulatory Visit: Payer: Self-pay

## 2018-12-26 DIAGNOSIS — Z23 Encounter for immunization: Secondary | ICD-10-CM

## 2019-04-03 ENCOUNTER — Encounter: Payer: Self-pay | Admitting: Family Medicine

## 2019-07-05 ENCOUNTER — Other Ambulatory Visit: Payer: Self-pay | Admitting: Family Medicine

## 2019-07-05 DIAGNOSIS — I10 Essential (primary) hypertension: Secondary | ICD-10-CM

## 2019-07-05 NOTE — Telephone Encounter (Signed)
Requested  medications are  due for refill today yes  Requested medications are on the active medication list yes  Last refill 1/11  Future visit scheduled no  Notes to clinic -last office visit 15 months ago, therefore failed protocol

## 2019-07-25 ENCOUNTER — Ambulatory Visit (INDEPENDENT_AMBULATORY_CARE_PROVIDER_SITE_OTHER): Payer: Medicare HMO | Admitting: Physician Assistant

## 2019-07-25 ENCOUNTER — Other Ambulatory Visit: Payer: Self-pay

## 2019-07-25 ENCOUNTER — Encounter: Payer: Self-pay | Admitting: Physician Assistant

## 2019-07-25 VITALS — BP 160/102 | HR 79 | Temp 96.8°F | Wt 176.8 lb

## 2019-07-25 DIAGNOSIS — K047 Periapical abscess without sinus: Secondary | ICD-10-CM

## 2019-07-25 MED ORDER — AMOXICILLIN-POT CLAVULANATE 875-125 MG PO TABS
1.0000 | ORAL_TABLET | Freq: Two times a day (BID) | ORAL | 0 refills | Status: DC
Start: 2019-07-25 — End: 2020-04-09

## 2019-07-25 NOTE — Progress Notes (Signed)
Established patient visit   Patient: Mark Hunter   DOB: April 13, 1931   86 y.o. Male  MRN: [id number] Visit Date: 07/25/2019  Today's healthcare provider: [name], PA-C   Chief Complaint  Patient presents with  . Dental Pain   Subjective    Dental Pain  This is a new problem. The current episode started 1 to 4 weeks ago. The problem occurs constantly. The problem has been gradually worsening. The pain is at a severity of 6/10. The pain is moderate. Pertinent negatives include no difficulty swallowing, facial pain, oral bleeding or sinus pressure. He has tried acetaminophen for the symptoms. The treatment provided mild relief.  Drinking hot/cold liquids does not irritates his teeth. Patient states he have appointment with dentist on 08/03/2019. He has a history of poor dentition.       Medications: Outpatient Medications Prior to Visit  Medication Sig  . amLODipine (NORVASC) 10 MG tablet TAKE 1 TABLET (10 MG TOTAL) BY MOUTH DAILY.  Marland Kitchen aspirin 81 MG tablet Take 81 mg by mouth daily.  Marland Kitchen glucosamine-chondroitin 500-400 MG tablet Take 1 tablet by mouth daily.  . hydrochlorothiazide (HYDRODIURIL) 25 MG tablet TAKE 1 TABLET BY MOUTH EVERY DAY  . metoprolol succinate (TOPROL-XL) 50 MG 24 hr tablet TAKE 1 TABLET BY MOUTH EVERY DAY  . Multiple Vitamin (MULTI-VITAMINS PO) Take 1 tablet by mouth once.  . Omega-3 Fatty Acids (FISH OIL PO) Take 1 capsule by mouth once. 4000 mg  . Propylene Glycol-Glycerin (SOOTHE) 0.6-0.6 % SOLN Place 3-4 drops into both eyes daily. Soothe  . tamsulosin (FLOMAX) 0.4 MG CAPS capsule TAKE 1 CAPSULE (0.4 MG TOTAL) BY MOUTH DAILY.   No facility-administered medications prior to visit.    Review of Systems  Constitutional: Negative.   HENT: Positive for dental problem. Negative for sinus pressure.   Respiratory: Negative.   Cardiovascular: Negative.   Hematological: Negative.       Objective    BP (!) 160/102 (BP Location: Left Arm, Patient  Position: Sitting, Cuff Size: Normal)   Pulse 79   Temp (!) 96.8 F (36 C) (Temporal)   Wt 176 lb 12.8 oz (80.2 kg)   SpO2 97%   BMI 26.88 kg/m    Physical Exam HENT:     Mouth/Throat:     Mouth: Mucous membranes are moist.     Dentition: Abnormal dentition. Dental tenderness, dental caries and gum lesions present.     Pharynx: Oropharynx is clear.  Skin:    General: Skin is warm and dry.  Neurological:     General: No focal deficit present.     Mental Status: He is oriented to person, place, and time.  Psychiatric:        Mood and Affect: Mood normal.        Behavior: Behavior normal.      No results found for any visits on 07/25/19.  Assessment & Plan    1. Dental abscess Patient has appointment with dentist on 08/03/2019 Patient was prescribed Augmentin for possible dental infection. - amoxicillin-clavulanate (AUGMENTIN) 875-125 MG tablet; Take 1 tablet by mouth 2 (two) times daily.  Dispense: 14 tablet; Refill: 0    Return if symptoms worsen or fail to improve.      ITrinna Post, PA-C, have reviewed all documentation for this visit. The documentation on 07/25/19 for the exam, diagnosis, procedures, and orders are all accurate and complete.    [name], PA-C  [facility] [phone] (phone) [phone] (fax)  [place]

## 2019-07-25 NOTE — Patient Instructions (Signed)
Dental Abscess  A dental abscess is an area of pus in or around a tooth. It comes from an infection. It can cause pain and other symptoms. Treatment will help with symptoms and prevent the infection from spreading. Follow these instructions at home: Medicines  Take over-the-counter and prescription medicines only as told by your dentist.  If you were prescribed an antibiotic medicine, take it as told by your dentist. Do not stop taking it even if you start to feel better.  If you were prescribed a gel that has numbing medicine in it, use it exactly as told.  Do not drive or use heavy machinery (like a lawn mower) while taking prescription pain medicine. General instructions  Rinse out your mouth often with salt water. ? To make salt water, dissolve -1 tsp of salt in 1 cup of warm water.  Eat a soft diet while your mouth is healing.  Drink enough fluid to keep your urine pale yellow.  Do not apply heat to the outside of your mouth.  Do not use any products that contain nicotine or tobacco. These include cigarettes and e-cigarettes. If you need help quitting, ask your doctor.  Keep all follow-up visits as told by your dentist. This is important. Prevent an abscess  Brush your teeth every morning and every night. Use fluoride toothpaste.  Floss your teeth each day.  Get dental cleanings as often as told by your dentist.  Think about getting dental sealant put on teeth that have deep holes (decay).  Drink water that has fluoride in it. ? Most tap water has fluoride. ? Check the label on bottled water to see if it has fluoride in it.  Drink water instead of sugary drinks.  Eat healthy meals and snacks.  Wear a mouth guard or face shield when you play sports. Contact a doctor if:  Your pain is worse, and medicine does not help. Get help right away if:  You have a fever or chills.  Your symptoms suddenly get worse.  You have a very bad headache.  You have problems  breathing or swallowing.  You have trouble opening your mouth.  You have swelling in your neck or close to your eye. Summary  A dental abscess is an area of pus in or around a tooth. It is caused by an infection.  Treatment will help with symptoms and prevent the infection from spreading.  Take over-the-counter and prescription medicines only as told by your dentist.  To prevent an abscess, take good care of your teeth. Brush your teeth every morning and night. Use floss every day.  Get dental cleanings as often as told by your dentist. This information is not intended to replace advice given to you by your health care provider. Make sure you discuss any questions you have with your health care provider. Document Revised: 07/05/2018 Document Reviewed: 11/15/2016 Elsevier Patient Education  2020 [employer]  

## 2019-08-03 DIAGNOSIS — R69 Illness, unspecified: Secondary | ICD-10-CM | POA: Diagnosis not present

## 2019-08-19 ENCOUNTER — Other Ambulatory Visit: Payer: Self-pay | Admitting: Family Medicine

## 2019-08-19 NOTE — Telephone Encounter (Signed)
Requested Prescriptions  Pending Prescriptions Disp Refills  . metoprolol succinate (TOPROL-XL) 50 MG 24 hr tablet [Pharmacy Med Name: METOPROLOL SUCC ER 50 MG TAB] 90 tablet 3    Sig: TAKE 1 TABLET BY MOUTH EVERY DAY     Cardiovascular:  Beta Blockers Failed - 08/19/2019  9:35 AM      Failed - Last BP in normal range    BP Readings from Last 1 Encounters:  07/25/19 (!) 160/102         Passed - Last Heart Rate in normal range    Pulse Readings from Last 1 Encounters:  07/25/19 79         Passed - Valid encounter within last 6 months    Recent Outpatient Visits          3 weeks ago Dental abscess   [place], [place], [place]   8 months ago Gouty arthropathy   [facility] [place], [place], [place]   1 year ago Essential (primary) hypertension   [facility] Birdie Sons, MD   1 year ago [place], Donald E, MD   1 year ago Acute otitis externa of right ear, unspecified type   [facility] Birdie Sons, MD

## 2019-09-07 DIAGNOSIS — R69 Illness, unspecified: Secondary | ICD-10-CM | POA: Diagnosis not present

## 2019-09-27 ENCOUNTER — Other Ambulatory Visit: Payer: Self-pay | Admitting: Family Medicine

## 2019-09-27 DIAGNOSIS — I10 Essential (primary) hypertension: Secondary | ICD-10-CM

## 2019-10-20 ENCOUNTER — Other Ambulatory Visit: Payer: Self-pay | Admitting: Family Medicine

## 2019-10-20 DIAGNOSIS — I10 Essential (primary) hypertension: Secondary | ICD-10-CM

## 2019-10-20 NOTE — Telephone Encounter (Signed)
Requested medication (s) are due for refill today: yes  Requested medication (s) are on the active medication list: yes  Last refill:  09/27/19 - courtesy RF  Future visit scheduled: no  Notes to clinic:  called pt and advised him that he needs to make an appt. Pt stated he thought he had about 30 pills left. Pt stated he will call back to make appt.   Requested Prescriptions  Pending Prescriptions Disp Refills   hydrochlorothiazide (HYDRODIURIL) 25 MG tablet [Pharmacy Med Name: HYDROCHLOROTHIAZIDE 25 MG TAB] 30 tablet 0    Sig: TAKE 1 TABLET BY MOUTH EVERY DAY      Cardiovascular: Diuretics - Thiazide Failed - 10/20/2019  1:33 PM      Failed - Ca in normal range and within 360 days    Calcium  Date Value Ref Range Status  04/12/2018 9.5 8.6 - 10.2 mg/dL Final          Failed - Cr in normal range and within 360 days    Creatinine, Ser  Date Value Ref Range Status  04/12/2018 1.03 0.76 - 1.27 mg/dL Final          Failed - K in normal range and within 360 days    Potassium  Date Value Ref Range Status  04/12/2018 3.4 (L) 3.5 - 5.2 mmol/L Final          Failed - Na in normal range and within 360 days    Sodium  Date Value Ref Range Status  04/12/2018 140 134 - 144 mmol/L Final          Failed - Last BP in normal range    BP Readings from Last 1 Encounters:  07/25/19 (!) 160/102          Passed - Valid encounter within last 6 months    Recent Outpatient Visits           2 months ago Dental abscess   [place], [place], [place]   10 months ago Gouty arthropathy   [facility] [name], [place]   1 year ago Essential (primary) hypertension   [facility] Birdie Sons, MD   1 year ago [place], Donald E, MD   2 years ago Acute otitis externa of right ear, unspecified type   [facility] Birdie Sons, MD

## 2019-10-25 ENCOUNTER — Encounter: Payer: Self-pay | Admitting: Physician Assistant

## 2019-10-25 ENCOUNTER — Ambulatory Visit (INDEPENDENT_AMBULATORY_CARE_PROVIDER_SITE_OTHER): Payer: Medicare HMO | Admitting: Physician Assistant

## 2019-10-25 ENCOUNTER — Other Ambulatory Visit: Payer: Self-pay

## 2019-10-25 VITALS — BP 132/68 | HR 68 | Temp 98.3°F | Wt 180.0 lb

## 2019-10-25 DIAGNOSIS — E781 Pure hyperglyceridemia: Secondary | ICD-10-CM | POA: Diagnosis not present

## 2019-10-25 DIAGNOSIS — I10 Essential (primary) hypertension: Secondary | ICD-10-CM

## 2019-10-25 DIAGNOSIS — Z125 Encounter for screening for malignant neoplasm of prostate: Secondary | ICD-10-CM

## 2019-10-25 DIAGNOSIS — R7303 Prediabetes: Secondary | ICD-10-CM | POA: Diagnosis not present

## 2019-10-25 MED ORDER — TAMSULOSIN HCL 0.4 MG PO CAPS: 0.4000 mg | ORAL_CAPSULE | Freq: Every day | ORAL | 1 refills | Status: DC

## 2019-10-25 MED ORDER — HYDROCHLOROTHIAZIDE 25 MG PO TABS: 25.0000 mg | ORAL_TABLET | Freq: Every day | ORAL | 1 refills | Status: DC

## 2019-10-25 MED ORDER — METOPROLOL SUCCINATE ER 50 MG PO TB24: 50.0000 mg | ORAL_TABLET | Freq: Every day | ORAL | 1 refills | Status: DC

## 2019-10-25 NOTE — Assessment & Plan Note (Signed)
Discussed healthy diet and exercise. Will check A1C today.

## 2019-10-25 NOTE — Patient Instructions (Signed)

## 2019-10-25 NOTE — Assessment & Plan Note (Addendum)
Bp slightly elevated but came back down to normal after the visit.  Continue current medications. Will check labs. Recheck in 6 months.   Electrolytes deranged. Recommend decreasing HCTZ to 12.5 mg daily.

## 2019-10-25 NOTE — Progress Notes (Signed)
I,[name],acting as a Education administrator for [employer], PA-C.,have documented all relevant documentation on the behalf of Mark Post, PA-C,as directed by  Mark Post, PA-C while in the presence of Mark Post, PA-C.   Established patient visit   Patient: Mark Hunter   DOB: 06-14-1931   86 y.o. Male  MRN: [id number] Visit Date: 10/25/2019  Today's healthcare provider: [name], PA-C   Chief Complaint  Patient presents with  . Hypertension   Subjective    HPI  Hypertension, follow-up  BP Readings from Last 3 Encounters:  10/25/19 (!) 132/68  07/25/19 (!) 160/102  04/12/18 130/60   Wt Readings from Last 3 Encounters:  10/25/19 180 lb (81.6 kg)  07/25/19 176 lb 12.8 oz (80.2 kg)  04/12/18 185 lb (83.9 kg)     He was last seen for hypertension 18 months ago.  BP at that visit was 130/60. Management since that visit includes no changes.  He reports excellent compliance with treatment. He is not having side effects.  He is following a Low Sodium diet. He is not exercising. He does not smoke.  Use of agents associated with hypertension: none.   Outside blood pressures are 140's/60-70's. Symptoms: No chest pain No chest pressure  No palpitations No syncope  No dyspnea No orthopnea  No paroxysmal nocturnal dyspnea No lower extremity edema   Pertinent labs: Lab Results  Component Value Date   CHOL 201 (H) 10/25/2019   HDL 38 (L) 10/25/2019   LDLCALC 121 (H) 10/25/2019   TRIG 238 (H) 10/25/2019   CHOLHDL 5.3 (H) 10/25/2019   Lab Results  Component Value Date   NA 133 (L) 10/25/2019   K 3.9 10/25/2019   CREATININE 0.98 10/25/2019   GFRNONAA 70 10/25/2019   GFRAA 80 10/25/2019   GLUCOSE 106 (H) 10/25/2019     The ASCVD Risk score (Goff DC Jr., et al., 2013) failed to calculate for the following reasons:   The 2013 ASCVD risk score is only valid for ages 38 to 55    Continues with Flomax which works well for BPH. Due for PSA  check.  ---------------------------------------------------------------------------------------------------      Medications: Outpatient Medications Prior to Visit  Medication Sig  . amLODipine (NORVASC) 10 MG tablet TAKE 1 TABLET (10 MG TOTAL) BY MOUTH DAILY.  Marland Kitchen aspirin 81 MG tablet Take 81 mg by mouth daily.  . Multiple Vitamin (MULTI-VITAMINS PO) Take 1 tablet by mouth once.  . Omega-3 Fatty Acids (FISH OIL PO) Take 1 capsule by mouth once. 4000 mg  . Propylene Glycol-Glycerin (SOOTHE) 0.6-0.6 % SOLN Place 3-4 drops into both eyes daily. Soothe  . [DISCONTINUED] hydrochlorothiazide (HYDRODIURIL) 25 MG tablet TAKE 1 TABLET BY MOUTH EVERY DAY  . [DISCONTINUED] metoprolol succinate (TOPROL-XL) 50 MG 24 hr tablet TAKE 1 TABLET BY MOUTH EVERY DAY  . [DISCONTINUED] tamsulosin (FLOMAX) 0.4 MG CAPS capsule TAKE 1 CAPSULE (0.4 MG TOTAL) BY MOUTH DAILY.  Marland Kitchen amoxicillin-clavulanate (AUGMENTIN) 875-125 MG tablet Take 1 tablet by mouth 2 (two) times daily.  Marland Kitchen glucosamine-chondroitin 500-400 MG tablet Take 1 tablet by mouth daily.   No facility-administered medications prior to visit.    Review of Systems  Constitutional: Negative.   Respiratory: Negative.   Cardiovascular: Negative.   Gastrointestinal: Negative.   Neurological: Negative for dizziness, light-headedness and headaches.      Objective    BP (!) 132/68 (BP Location: Right Arm, Patient Position: Sitting, Cuff Size: Large)  Pulse 68   Temp 98.3 F (36.8 C) (Oral)   Wt 180 lb (81.6 kg)   SpO2 96%   BMI 27.37 kg/m    Physical Exam Constitutional:      Appearance: Normal appearance.  Cardiovascular:     Rate and Rhythm: Normal rate and regular rhythm.     Pulses: Normal pulses.     Heart sounds: Normal heart sounds.  Pulmonary:     Effort: Pulmonary effort is normal.     Breath sounds: Normal breath sounds.  Neurological:     Mental Status: He is alert and oriented to person, place, and time. Mental status is at  baseline.  Psychiatric:        Mood and Affect: Mood normal.        Behavior: Behavior normal.        Thought Content: Thought content normal.        Judgment: Judgment normal.       Results for orders placed or performed in visit on 10/25/19  CBC with Differential/Platelet  Result Value Ref Range   WBC 5.8 3.4 - 10.8 x10E3/uL   RBC 4.35 4.14 - 5.80 x10E6/uL   Hemoglobin 15.0 13.0 - 17.7 g/dL   Hematocrit 40.8 37.5 - 51.0 %   MCV 94 79 - 97 fL   MCH 34.5 (H) 26.6 - 33.0 pg   MCHC 36.8 (H) 31 - 35 g/dL   RDW 12.8 11.6 - 15.4 %   Platelets 303 150 - 450 x10E3/uL   Neutrophils 62 Not Estab. %   Lymphs 25 Not Estab. %   Monocytes 11 Not Estab. %   Eos 1 Not Estab. %   Basos 1 Not Estab. %   Neutrophils Absolute 3.6 1 - 7 x10E3/uL   Lymphocytes Absolute 1.4 0 - 3 x10E3/uL   Monocytes Absolute 0.7 0 - 0 x10E3/uL   EOS (ABSOLUTE) 0.1 0.0 - 0.4 x10E3/uL   Basophils Absolute 0.0 0 - 0 x10E3/uL   Immature Granulocytes 0 Not Estab. %   Immature Grans (Abs) 0.0 0.0 - 0.1 x10E3/uL   Hematology Comments: Note:   Hemoglobin A1c  Result Value Ref Range   Hgb A1c MFr Bld 6.1 (H) 4.8 - 5.6 %   Est. average glucose Bld gHb Est-mCnc 128 mg/dL  Lipid panel  Result Value Ref Range   Cholesterol, Total 201 (H) 100 - 199 mg/dL   Triglycerides 238 (H) 0 - 149 mg/dL   HDL 38 (L) >39 mg/dL   VLDL Cholesterol Cal 42 (H) 5 - 40 mg/dL   LDL Chol Calc (NIH) 121 (H) 0 - 99 mg/dL   Chol/HDL Ratio 5.3 (H) 0.0 - 5.0 ratio  Comprehensive metabolic panel  Result Value Ref Range   Glucose 106 (H) 65 - 99 mg/dL   BUN 15 8 - 27 mg/dL   Creatinine, Ser 0.98 0.76 - 1.27 mg/dL   GFR calc non Af Amer 70 >59 mL/min/1.73   GFR calc Af Amer 80 >59 mL/min/1.73   BUN/Creatinine Ratio 15 10 - 24   Sodium 133 (L) 134 - 144 mmol/L   Potassium 3.9 3.5 - 5.2 mmol/L   Chloride 92 (L) 96 - 106 mmol/L   CO2 25 20 - 29 mmol/L   Calcium 10.3 (H) 8.6 - 10.2 mg/dL   Total Protein 7.4 6.0 - 8.5 g/dL   Albumin 4.8 (H)  3.6 - 4.6 g/dL   Globulin, Total 2.6 1.5 - 4.5 g/dL   Albumin/Globulin Ratio 1.8 1.2 -  2.2   Bilirubin Total 0.6 0.0 - 1.2 mg/dL   Alkaline Phosphatase 48 48 - 121 IU/L   AST 20 0 - 40 IU/L   ALT 16 0 - 44 IU/L  PSA  Result Value Ref Range   Prostate Specific Ag, Serum 0.1 0.0 - 4.0 ng/mL  TSH  Result Value Ref Range   TSH 1.040 0.450 - 4.500 uIU/mL    Assessment & Plan     Problem List Items Addressed This Visit      Cardiovascular and Mediastinum   Essential (primary) hypertension - Primary    Bp slightly elevated but came back down to normal after the visit.  Continue current medications. Will check labs. Recheck in 6 months.   Electrolytes deranged. Recommend decreasing HCTZ to 12.5 mg daily.       Relevant Medications   metoprolol succinate (TOPROL-XL) 50 MG 24 hr tablet   hydrochlorothiazide (HYDRODIURIL) 25 MG tablet   Other Relevant Orders   CBC with Differential/Platelet (Completed)   Lipid panel (Completed)   Comprehensive metabolic panel (Completed)   TSH (Completed)     Other   Pre-diabetes    Discussed healthy diet and exercise. Will check A1C today.      Relevant Orders   Hemoglobin A1c (Completed)   Lipid panel (Completed)    Other Visit Diagnoses    High triglycerides       Relevant Medications   metoprolol succinate (TOPROL-XL) 50 MG 24 hr tablet   hydrochlorothiazide (HYDRODIURIL) 25 MG tablet   Other Relevant Orders   Lipid panel (Completed)   Screening for prostate cancer       Relevant Orders   PSA (Completed)       Return in about 3 months (around 01/25/2020) for chronic.      ITrinna Post, PA-C, have reviewed all documentation for this visit. The documentation on 10/26/19 for the exam, diagnosis, procedures, and orders are all accurate and complete.    Paulene Floor  [facility] [phone] (phone) [phone] (fax)  [place]

## 2019-10-26 LAB — CBC WITH DIFFERENTIAL/PLATELET
Basophils Absolute: 0 10*3/uL (ref 0.0–0.2)
Basos: 1 %
EOS (ABSOLUTE): 0.1 10*3/uL (ref 0.0–0.4)
Eos: 1 %
Hematocrit: 40.8 % (ref 37.5–51.0)
Hemoglobin: 15 g/dL (ref 13.0–17.7)
Immature Grans (Abs): 0 10*3/uL (ref 0.0–0.1)
Immature Granulocytes: 0 %
Lymphocytes Absolute: 1.4 10*3/uL (ref 0.7–3.1)
Lymphs: 25 %
MCH: 34.5 pg — ABNORMAL HIGH (ref 26.6–33.0)
MCHC: 36.8 g/dL — ABNORMAL HIGH (ref 31.5–35.7)
MCV: 94 fL (ref 79–97)
Monocytes Absolute: 0.7 10*3/uL (ref 0.1–0.9)
Monocytes: 11 %
Neutrophils Absolute: 3.6 10*3/uL (ref 1.4–7.0)
Neutrophils: 62 %
Platelets: 303 10*3/uL (ref 150–450)
RBC: 4.35 x10E6/uL (ref 4.14–5.80)
RDW: 12.8 % (ref 11.6–15.4)
WBC: 5.8 10*3/uL (ref 3.4–10.8)

## 2019-10-26 LAB — LIPID PANEL
Chol/HDL Ratio: 5.3 ratio — ABNORMAL HIGH (ref 0.0–5.0)
Cholesterol, Total: 201 mg/dL — ABNORMAL HIGH (ref 100–199)
HDL: 38 mg/dL — ABNORMAL LOW (ref >39)
LDL Chol Calc (NIH): 121 mg/dL — ABNORMAL HIGH (ref 0–99)
Triglycerides: 238 mg/dL — ABNORMAL HIGH (ref 0–149)
VLDL Cholesterol Cal: 42 mg/dL — ABNORMAL HIGH (ref 5–40)

## 2019-10-26 LAB — COMPREHENSIVE METABOLIC PANEL
ALT: 16 IU/L (ref 0–44)
AST: 20 IU/L (ref 0–40)
Albumin/Globulin Ratio: 1.8 (ref 1.2–2.2)
Albumin: 4.8 g/dL — ABNORMAL HIGH (ref 3.6–4.6)
Alkaline Phosphatase: 48 IU/L (ref 48–121)
BUN/Creatinine Ratio: 15 (ref 10–24)
BUN: 15 mg/dL (ref 8–27)
Bilirubin Total: 0.6 mg/dL (ref 0.0–1.2)
CO2: 25 mmol/L (ref 20–29)
Calcium: 10.3 mg/dL — ABNORMAL HIGH (ref 8.6–10.2)
Chloride: 92 mmol/L — ABNORMAL LOW (ref 96–106)
Creatinine, Ser: 0.98 mg/dL (ref 0.76–1.27)
GFR calc Af Amer: 80 mL/min/{1.73_m2} (ref >59)
GFR calc non Af Amer: 70 mL/min/{1.73_m2} (ref >59)
Globulin, Total: 2.6 g/dL (ref 1.5–4.5)
Glucose: 106 mg/dL — ABNORMAL HIGH (ref 65–99)
Potassium: 3.9 mmol/L (ref 3.5–5.2)
Sodium: 133 mmol/L — ABNORMAL LOW (ref 134–144)
Total Protein: 7.4 g/dL (ref 6.0–8.5)

## 2019-10-26 LAB — HEMOGLOBIN A1C
Est. average glucose Bld gHb Est-mCnc: 128 mg/dL
Hgb A1c MFr Bld: 6.1 % — ABNORMAL HIGH (ref 4.8–5.6)

## 2019-10-26 LAB — TSH: TSH: 1.04 u[IU]/mL (ref 0.450–4.500)

## 2019-10-26 LAB — PSA: Prostate Specific Ag, Serum: 0.1 ng/mL (ref 0.0–4.0)

## 2019-12-03 ENCOUNTER — Other Ambulatory Visit: Payer: Self-pay | Admitting: Family Medicine

## 2019-12-12 DIAGNOSIS — R69 Illness, unspecified: Secondary | ICD-10-CM | POA: Diagnosis not present

## 2020-01-10 ENCOUNTER — Other Ambulatory Visit: Payer: Self-pay

## 2020-01-10 ENCOUNTER — Ambulatory Visit (INDEPENDENT_AMBULATORY_CARE_PROVIDER_SITE_OTHER): Payer: Medicare Other

## 2020-01-10 DIAGNOSIS — Z23 Encounter for immunization: Secondary | ICD-10-CM

## 2020-01-16 NOTE — Progress Notes (Signed)
Subjective:   Mark Hunter is a 86 y.o. male who presents for Medicare Annual/Subsequent preventive examination.  I connected with Mark Hunter today by telephone and verified that I am speaking with the correct person using two identifiers. Location patient: home Location provider: work Persons participating in the virtual visit: patient, provider.   I discussed the limitations, risks, security and privacy concerns of performing an evaluation and management service by telephone and the availability of in person appointments. I also discussed with the patient that there may be a patient responsible charge related to this service. The patient expressed understanding and verbally consented to this telephonic visit.    Interactive audio and video telecommunications were attempted between this provider and patient, however failed, due to patient having technical difficulties OR patient did not have access to video capability.  We continued and completed visit with audio only.   Review of Systems    N/A  Cardiac Risk Factors include: advanced age (>69men, >72 women);male gender;hypertension     Objective:    There were no vitals filed for this visit. There is no height or weight on file to calculate BMI.  Advanced Directives 01/17/2020 11/20/2018 07/13/2017 06/23/2016 04/21/2016 12/16/2014  Does Patient Have a Medical Advance Directive? Yes Yes Yes Yes Yes (No Data)  Type of Paramedic of [place];Hunter will [place];Hunter will [place];Hunter will Hunter will;Healthcare Power of [place];Hunter will -  Does patient want to make changes to medical advance directive? - - - - No - Patient declined -  Copy of [place] in Chart? No - copy requested No - copy requested No - copy requested No - copy requested No - copy requested -    Current Medications (verified) Outpatient  Encounter Medications as of 01/17/2020  Medication Sig  . amLODipine (NORVASC) 10 MG tablet TAKE 1 TABLET (10 MG TOTAL) BY MOUTH DAILY.  Marland Kitchen aspirin 81 MG tablet Take 81 mg by mouth daily.  . hydrochlorothiazide (HYDRODIURIL) 25 MG tablet Take 1 tablet (25 mg total) by mouth daily.  . metoprolol succinate (TOPROL-XL) 50 MG 24 hr tablet Take 1 tablet (50 mg total) by mouth daily. Take with or immediately following a meal.  . Multiple Vitamin (MULTI-VITAMINS PO) Take 1 tablet by mouth once.  . Omega-3 Fatty Acids (FISH OIL PO) Take 1 capsule by mouth once. 4000 mg  . Propylene Glycol-Glycerin (SOOTHE) 0.6-0.6 % SOLN Place 3-4 drops into both eyes daily. Soothe  . tamsulosin (FLOMAX) 0.4 MG CAPS capsule Take 1 capsule (0.4 mg total) by mouth daily.  Marland Kitchen amoxicillin-clavulanate (AUGMENTIN) 875-125 MG tablet Take 1 tablet by mouth 2 (two) times daily. (Patient not taking: Reported on 01/17/2020)  . glucosamine-chondroitin 500-400 MG tablet Take 1 tablet by mouth daily. (Patient not taking: Reported on 01/17/2020)   No facility-administered encounter medications on file as of 01/17/2020.    Allergies (verified) Patient has no known allergies.   History: Past Medical History:  Diagnosis Date  . BPH (benign prostatic hyperplasia)   . Cancer [facility]) 2003   prostate  . Cancer ([place]) 12/15/2015   SCC  . Gout   . History of measles   . History of mumps   . Hyperlipidemia   . Hypertension    Past Surgical History:  Procedure Laterality Date  . CATARACT EXTRACTION W/PHACO Right 12/16/2014   Procedure: CATARACT EXTRACTION PHACO AND INTRAOCULAR LENS PLACEMENT ([place]);  Surgeon: Estill Cotta, MD;  Location: [facility] ORS;  Service: Ophthalmology;  Laterality: Right;  [place]   1:12   . CATARACT EXTRACTION W/PHACO Left 04/21/2016   Procedure: CATARACT EXTRACTION PHACO AND INTRAOCULAR LENS PLACEMENT ([facility]);  Surgeon: Estill Cotta, MD;  Location: [facility] ORS;  Service: Ophthalmology;  Laterality: Left;  [place]  01:27 AP% 21.8 CDE 34.78 Fluid pack lot # [id number]  . COLONOSCOPY  04/11/2007   Dr. Donnella Sham, [facility]. Hyperplastic polyps; radiation colitis, multiple colonic angioectasias  . LEG / ANKLE SOFT TISSUE BIOPSY    . PROSTATE SURGERY  02/2002   seed implant  . SHOULDER ARTHROSCOPY W/ ROTATOR CUFF REPAIR Right 05/03/2010  . SQUAMOUS CELL CARCINOMA EXCISION  12/14/2016   on right cheek   Family History  Problem Relation Age of Onset  . Stroke Father   . Cancer Brother        of eye, removed eyeball  . Diabetes Neg Hx   . Heart disease Neg Hx    Social History   Socioeconomic History  . Marital status: Married    Spouse name: Not on file  . Number of children: 3  . Years of education: Not on file  . Highest education level: Some college, no degree  Occupational History  . Occupation: Retired  Tobacco Use  . Smoking status: Never Smoker  . Smokeless tobacco: Never Used  Vaping Use  . Vaping Use: Never used  Substance and Sexual Activity  . Alcohol use: Yes    Alcohol/week: 7.0 standard drinks    Types: 7 Cans of beer per week    Comment: 1 beer a night  . Drug use: No  . Sexual activity: Not on file  Other Topics Concern  . Not on file  Social History Narrative  . Not on file   Social Determinants of Health   Financial Resource Strain: Low Risk   . Difficulty of Paying Hunter Expenses: Not hard at all  Food Insecurity: No Food Insecurity  . Worried About Charity fundraiser in the Last Year: Never true  . Ran Out of Food in the Last Year: Never true  Transportation Needs: No Transportation Needs  . Lack of Transportation (Medical): No  . Lack of Transportation (Non-Medical): No  Physical Activity: Inactive  . Days of Exercise per Week: 0 days  . Minutes of Exercise per Session: 0 min  Stress: No Stress Concern Present  . Feeling of Stress : Not at all  Social Connections: Moderately Isolated  . Frequency of Communication with Friends and Family: More than three  times a week  . Frequency of Social Gatherings with Friends and Family: Three times a week  . Attends Religious Services: Never  . Active Member of Clubs or Organizations: No  . Attends Archivist Meetings: Never  . Marital Status: Married    Tobacco Counseling Counseling given: Not Answered   Clinical Intake:  Pre-visit preparation completed: Yes  Pain : No/denies pain     Nutritional Risks: None Diabetes: No  How often do you need to have someone help you when you read instructions, pamphlets, or other written materials from your doctor or pharmacy?: 1 - Never  Diabetic? Prediabetic  Interpreter Needed?: No  Information entered by :: [facility], LPN   Activities of Daily Hunter In your present state of health, do you have any difficulty performing the following activities: 01/17/2020  Hearing? N  Vision? N  Difficulty concentrating or making decisions? N  Walking or climbing stairs? N  Dressing or bathing?  N  Doing errands, shopping? N  Preparing Food and eating ? N  Using the Toilet? N  In the past six months, have you accidently leaked urine? N  Do you have problems with loss of bowel control? N  Managing your Medications? N  Managing your Finances? N  Housekeeping or managing your Housekeeping? N  Some recent data might be hidden    Patient Care Team: Birdie Sons, MD as PCP - General (Family Medicine) Dingeldein, Remo Lipps, MD as Consulting Physician (Ophthalmology) Jannet Mantis, MD as Consulting Physician (Dermatology)  Indicate any recent Medical Services you may have received from other than [facility] providers in the past year (date may be approximate).     Assessment:   This is a routine wellness examination for [place].  Hearing/Vision screen No exam data present  Dietary issues and exercise activities discussed: Current Exercise Habits: The patient does not participate in regular exercise at present, Exercise limited by: None  identified  Goals    . DIET - INCREASE WATER INTAKE     Recommend increasing water intake to 4 glasses a day.     . Exercise      Recommend to start walking 3 days a week for at least 30 minutes at a time.       Depression Screen PHQ 2/9 Scores 01/17/2020 11/20/2018 07/13/2017 07/13/2017 06/23/2016 06/23/2016 07/17/2015  PHQ - 2 Score 0 0 0 0 0 0 0  PHQ- 9 Score - - 0 - 0 - 0    Fall Risk Fall Risk  01/17/2020 11/20/2018 07/13/2017 06/23/2016 07/17/2015  Falls in the past year? 0 0 No No No  Number falls in past yr: 0 - - - -  Injury with Fall? 0 - - - -    Any stairs in or around the home? Yes  If so, are there any without handrails? No  Home free of loose throw rugs in walkways, pet beds, electrical cords, etc? Yes  Adequate lighting in your home to reduce risk of falls? Yes   ASSISTIVE DEVICES UTILIZED TO PREVENT FALLS:  Life alert? No  Use of a cane, walker or w/c? No  Grab bars in the bathroom? No  Shower chair or bench in shower? No  Elevated toilet seat or a handicapped toilet? No   Cognitive Function:     6CIT Screen 01/17/2020 11/20/2018 07/13/2017 06/23/2016  What Year? 0 points 0 points 0 points 0 points  What month? 0 points 0 points 0 points 0 points  What time? 3 points 0 points 0 points 0 points  Count back from 20 0 points 0 points 0 points 0 points  Months in reverse 0 points 0 points 0 points 0 points  Repeat phrase 0 points 0 points 2 points 0 points  Total Score 3 0 2 0    Immunizations Immunization History  Administered Date(s) Administered  . Fluad Quad(high Dose 65+) 12/26/2018  . H1N1 02/08/2008  . Influenza Split 12/22/2009  . Influenza, High Dose Seasonal PF 01/17/2017, 12/22/2017, 12/12/2019  . Influenza-Unspecified 12/24/2017  . PFIZER SARS-COV-2 Vaccination 04/05/2019, 04/26/2019, 01/10/2020  . Pneumococcal Conjugate-13 04/23/2014  . Pneumococcal Polysaccharide-23 01/22/2004  . Tdap 07/17/2015  . Zoster 04/13/2006    TDAP status: Up to  date Flu Vaccine status: Up to date Pneumococcal vaccine status: Up to date Covid-19 vaccine status: Completed vaccines  Qualifies for Shingles Vaccine? Yes   Zostavax completed Yes   Shingrix Completed?: No.    Education has been provided  regarding the importance of this vaccine. Patient has been advised to call insurance company to determine out of pocket expense if they have not yet received this vaccine. Advised may also receive vaccine at local pharmacy or Health Dept. Verbalized acceptance and understanding.  Screening Tests Health Maintenance  Topic Date Due  . TETANUS/TDAP  07/16/2025  . INFLUENZA VACCINE  Completed  . COVID-19 Vaccine  Completed  . PNA vac Low Risk Adult  Completed    Health Maintenance  There are no preventive care reminders to display for this patient.  Colorectal cancer screening: No longer required.   Lung Cancer Screening: (Low Dose CT Chest recommended if Age 47-80 years, 30 pack-year currently smoking OR have quit w/in 15years.) does not qualify.   Additional Screening:  Vision Screening: Recommended annual ophthalmology exams for early detection of glaucoma and other disorders of the eye. Is the patient up to date with their annual eye exam?  Yes  Who is the provider or what is the name of the office in which the patient attends annual eye exams? Dr Dingeldein @ [facility] If pt is not established with a provider, would they like to be referred to a provider to establish care? No .   Dental Screening: Recommended annual dental exams for proper oral hygiene  Community Resource Referral / Chronic Care Management: CRR required this visit?  No   CCM required this visit?  No      Plan:     I have personally reviewed and noted the following in the patient's chart:   . Medical and social history . Use of alcohol, tobacco or illicit drugs  . Current medications and supplements . Functional ability and status . Nutritional status . Physical  activity . Advanced directives . List of other physicians . Hospitalizations, surgeries, and ER visits in previous 12 months . Vitals . Screenings to include cognitive, depression, and falls . Referrals and appointments  In addition, I have reviewed and discussed with patient certain preventive protocols, quality metrics, and best practice recommendations. A written personalized care plan for preventive services as well as general preventive health recommendations were provided to patient.     Taleigha Pinson [place], [place]   [id number]   Nurse Notes: None

## 2020-01-17 ENCOUNTER — Other Ambulatory Visit: Payer: Self-pay

## 2020-01-17 ENCOUNTER — Ambulatory Visit (INDEPENDENT_AMBULATORY_CARE_PROVIDER_SITE_OTHER): Payer: Medicare HMO

## 2020-01-17 DIAGNOSIS — Z0000 Encounter for general adult medical examination without abnormal findings: Secondary | ICD-10-CM | POA: Diagnosis not present

## 2020-01-17 NOTE — Patient Instructions (Addendum)
Mark Hunter , Thank you for taking time to come for your Medicare Wellness Visit. I appreciate your ongoing commitment to your health goals. Please review the following plan we discussed and let me know if I can assist you in the future.   Screening recommendations/referrals: Colonoscopy: No longer required.  Recommended yearly ophthalmology/optometry visit for glaucoma screening and checkup Recommended yearly dental visit for hygiene and checkup  Vaccinations: Influenza vaccine: Done 12/12/19 Pneumococcal vaccine: Completed series Tdap vaccine: Up to date, due 06/2025 Shingles vaccine: Shingrix discussed. Please contact your pharmacy for coverage information.     Advanced directives: Please bring a copy of your POA (Power of Attorney) and/or Living Will to your next appointment.   Conditions/risks identified: Recommend to start walking 3 days a week for at least 30 minutes at a time. Recommend to increase water intake to 6-8 8 oz glasses a day.   Next appointment: 04/10/19 @ 10:00 AM with Dr Caryn Section  Preventive Care 48 Years and Older, Male Preventive care refers to lifestyle choices and visits with your health care provider that can promote health and wellness. What does preventive care include?  A yearly physical exam. This is also called an annual well check.  Dental exams once or twice a year.  Routine eye exams. Ask your health care provider how often you should have your eyes checked.  Personal lifestyle choices, including:  Daily care of your teeth and gums.  Regular physical activity.  Eating a healthy diet.  Avoiding tobacco and drug use.  Limiting alcohol use.  Practicing safe sex.  Taking low doses of aspirin every day.  Taking vitamin and mineral supplements as recommended by your health care provider. What happens during an annual well check? The services and screenings done by your health care provider during your annual well check will depend on your age,  overall health, lifestyle risk factors, and family history of disease. Counseling  Your health care provider may ask you questions about your:  Alcohol use.  Tobacco use.  Drug use.  Emotional well-being.  Home and relationship well-being.  Sexual activity.  Eating habits.  History of falls.  Memory and ability to understand (cognition).  Work and work Statistician. Screening  You may have the following tests or measurements:  Height, weight, and BMI.  Blood pressure.  Lipid and cholesterol levels. These may be checked every 5 years, or more frequently if you are over 7 years old.  Skin check.  Lung cancer screening. You may have this screening every year starting at age 31 if you have a 30-pack-year history of smoking and currently smoke or have quit within the past 15 years.  Fecal occult blood test (FOBT) of the stool. You may have this test every year starting at age 74.  Flexible sigmoidoscopy or colonoscopy. You may have a sigmoidoscopy every 5 years or a colonoscopy every 10 years starting at age 53.  Prostate cancer screening. Recommendations will vary depending on your family history and other risks.  Hepatitis C blood test.  Hepatitis B blood test.  Sexually transmitted disease (STD) testing.  Diabetes screening. This is done by checking your blood sugar (glucose) after you have not eaten for a while (fasting). You may have this done every 1-3 years.  Abdominal aortic aneurysm (AAA) screening. You may need this if you are a current or former smoker.  Osteoporosis. You may be screened starting at age 48 if you are at high risk. Talk with your health care provider about  your test results, treatment options, and if necessary, the need for more tests. Vaccines  Your health care provider may recommend certain vaccines, such as:  Influenza vaccine. This is recommended every year.  Tetanus, diphtheria, and acellular pertussis (Tdap, Td) vaccine. You may  need a Td booster every 10 years.  Zoster vaccine. You may need this after age 60.  Pneumococcal 13-valent conjugate (PCV13) vaccine. One dose is recommended after age 67.  Pneumococcal polysaccharide (PPSV23) vaccine. One dose is recommended after age 27. Talk to your health care provider about which screenings and vaccines you need and how often you need them. This information is not intended to replace advice given to you by your health care provider. Make sure you discuss any questions you have with your health care provider. Document Released: 04/11/2015 Document Revised: 12/03/2015 Document Reviewed: 01/14/2015 Elsevier Interactive Patient Education  2017 [place] Prevention in the Home Falls can cause injuries. They can happen to people of all ages. There are many things you can do to make your home safe and to help prevent falls. What can I do on the outside of my home?  Regularly fix the edges of walkways and driveways and fix any cracks.  Remove anything that might make you trip as you walk through a door, such as a raised step or threshold.  Trim any bushes or trees on the path to your home.  Use bright outdoor lighting.  Clear any walking paths of anything that might make someone trip, such as rocks or tools.  Regularly check to see if handrails are loose or broken. Make sure that both sides of any steps have handrails.  Any raised decks and porches should have guardrails on the edges.  Have any leaves, snow, or ice cleared regularly.  Use sand or salt on walking paths during winter.  Clean up any spills in your garage right away. This includes oil or grease spills. What can I do in the bathroom?  Use night lights.  Install grab bars by the toilet and in the tub and shower. Do not use towel bars as grab bars.  Use non-skid mats or decals in the tub or shower.  If you need to sit down in the shower, use a plastic, non-slip stool.  Keep the floor  dry. Clean up any water that spills on the floor as soon as it happens.  Remove soap buildup in the tub or shower regularly.  Attach bath mats securely with double-sided non-slip rug tape.  Do not have throw rugs and other things on the floor that can make you trip. What can I do in the bedroom?  Use night lights.  Make sure that you have a light by your bed that is easy to reach.  Do not use any sheets or blankets that are too big for your bed. They should not hang down onto the floor.  Have a firm chair that has side arms. You can use this for support while you get dressed.  Do not have throw rugs and other things on the floor that can make you trip. What can I do in the kitchen?  Clean up any spills right away.  Avoid walking on wet floors.  Keep items that you use a lot in easy-to-reach places.  If you need to reach something above you, use a strong step stool that has a grab bar.  Keep electrical cords out of the way.  Do not use floor polish or wax  that makes floors slippery. If you must use wax, use non-skid floor wax.  Do not have throw rugs and other things on the floor that can make you trip. What can I do with my stairs?  Do not leave any items on the stairs.  Make sure that there are handrails on both sides of the stairs and use them. Fix handrails that are broken or loose. Make sure that handrails are as long as the stairways.  Check any carpeting to make sure that it is firmly attached to the stairs. Fix any carpet that is loose or worn.  Avoid having throw rugs at the top or bottom of the stairs. If you do have throw rugs, attach them to the floor with carpet tape.  Make sure that you have a light switch at the top of the stairs and the bottom of the stairs. If you do not have them, ask someone to add them for you. What else can I do to help prevent falls?  Wear shoes that:  Do not have high heels.  Have rubber bottoms.  Are comfortable and fit you  well.  Are closed at the toe. Do not wear sandals.  If you use a stepladder:  Make sure that it is fully opened. Do not climb a closed stepladder.  Make sure that both sides of the stepladder are locked into place.  Ask someone to hold it for you, if possible.  Clearly mark and make sure that you can see:  Any grab bars or handrails.  First and last steps.  Where the edge of each step is.  Use tools that help you move around (mobility aids) if they are needed. These include:  Canes.  Walkers.  Scooters.  Crutches.  Turn on the lights when you go into a dark area. Replace any light bulbs as soon as they burn out.  Set up your furniture so you have a clear path. Avoid moving your furniture around.  If any of your floors are uneven, fix them.  If there are any pets around you, be aware of where they are.  Review your medicines with your doctor. Some medicines can make you feel dizzy. This can increase your chance of falling. Ask your doctor what other things that you can do to help prevent falls. This information is not intended to replace advice given to you by your health care provider. Make sure you discuss any questions you have with your health care provider. Document Released: 01/09/2009 Document Revised: 08/21/2015 Document Reviewed: 04/19/2014 Elsevier Interactive Patient Education  2017 [employer].

## 2020-01-31 ENCOUNTER — Ambulatory Visit: Payer: Self-pay

## 2020-04-09 ENCOUNTER — Encounter: Payer: Self-pay | Admitting: Family Medicine

## 2020-04-09 ENCOUNTER — Other Ambulatory Visit: Payer: Self-pay

## 2020-04-09 ENCOUNTER — Ambulatory Visit (INDEPENDENT_AMBULATORY_CARE_PROVIDER_SITE_OTHER): Payer: Medicare HMO | Admitting: Family Medicine

## 2020-04-09 VITALS — BP 140/60 | HR 65 | Temp 98.6°F | Resp 16 | Ht 68.0 in | Wt 178.0 lb

## 2020-04-09 DIAGNOSIS — R7303 Prediabetes: Secondary | ICD-10-CM

## 2020-04-09 DIAGNOSIS — Z8546 Personal history of malignant neoplasm of prostate: Secondary | ICD-10-CM

## 2020-04-09 DIAGNOSIS — Z0000 Encounter for general adult medical examination without abnormal findings: Secondary | ICD-10-CM | POA: Diagnosis not present

## 2020-04-09 DIAGNOSIS — Z289 Immunization not carried out for unspecified reason: Secondary | ICD-10-CM

## 2020-04-09 DIAGNOSIS — I10 Essential (primary) hypertension: Secondary | ICD-10-CM

## 2020-04-09 MED ORDER — SHINGRIX 50 MCG/0.5ML IM SUSR: 0.5000 mL | Freq: Once | INTRAMUSCULAR | 0 refills | Status: AC

## 2020-04-09 NOTE — Patient Instructions (Addendum)
.   Please review the attached list of medications and notify my office if there are any errors.    The CDC recommends two doses of Shingrix (the shingles vaccine) separated by 2 to 6 months for adults age 85 years and older. I recommend checking with your pharmacy plan regarding coverage for this vaccine.     . Please go to the lab draw station in [address] on the second floor of [facility]  when you are fasting for 8 hours. Normal hours are 8:00am to 11:30am and 1:00pm to 4:00pm Monday through Friday

## 2020-04-09 NOTE — Progress Notes (Signed)
Complete physical exam   Patient: Mark Hunter   DOB: April 18, 1931   87 y.o. Male  MRN: [id number] Visit Date: 04/09/2020  Today's healthcare provider: Lelon Huh, MD   Chief Complaint  Patient presents with  . Annual Exam  . Hypertension   Subjective    [name] is a 87 y.o. male who presents today for a complete physical exam.  He reports consuming a general diet. The patient does not participate in regular exercise at present. He generally feels fairly well. He reports sleeping fairly well. He does not have additional problems to discuss today.   Had AWV with [facility] on 01/17/2020   HPI  Hypertension, follow-up  BP Readings from Last 3 Encounters:  04/09/20 (!) 155/78  10/25/19 (!) 132/68  07/25/19 (!) 160/102   Wt Readings from Last 3 Encounters:  04/09/20 178 lb (80.7 kg)  10/25/19 180 lb (81.6 kg)  07/25/19 176 lb 12.8 oz (80.2 kg)     He was last seen for hypertension 5 months ago (seen by Carles Collet, PA-C).  BP at that visit was 132/68. Management since that visit includes decreasing HCTZ to 12.5mg  daily due to low sodium and electrolyte levels on labs  He reports fair compliance with treatment. Patient has been taking one full tablet of HCTZ daily. He is not having side effects.  He is following a Regular diet. He is not exercising. He does not smoke.  Use of agents associated with hypertension: NSAIDS.   Outside blood pressures are 140's /60-72. Symptoms: No chest pain No chest pressure  No palpitations No syncope  No dyspnea No orthopnea  No paroxysmal nocturnal dyspnea No lower extremity edema   Pertinent labs: Lab Results  Component Value Date   CHOL 201 (H) 10/25/2019   HDL 38 (L) 10/25/2019   LDLCALC 121 (H) 10/25/2019   TRIG 238 (H) 10/25/2019   CHOLHDL 5.3 (H) 10/25/2019   Lab Results  Component Value Date   NA 133 (L) 10/25/2019   K 3.9 10/25/2019   CREATININE 0.98 10/25/2019   GFRNONAA 70 10/25/2019   GFRAA 80  10/25/2019   GLUCOSE 106 (H) 10/25/2019     The ASCVD Risk score (Goff DC Jr., et al., 2013) failed to calculate for the following reasons:   The 2013 ASCVD risk score is only valid for ages 56 to 53   ---------------------------------------------------------------------------------------------------  Prediabetes, Follow-up  Lab Results  Component Value Date   HGBA1C 6.1 (H) 10/25/2019   HGBA1C 6.1 (A) 01/23/2018   HGBA1C 6.0 (H) 07/18/2017   GLUCOSE 106 (H) 10/25/2019   GLUCOSE 151 (H) 04/12/2018   GLUCOSE 116 (H) 07/18/2017    Last seen for for this 5 months ago.  Management since that visit includes continue healthy diet. Current symptoms include none and have been stable.  Prior visit with dietician: no Current diet: well balanced Current exercise: none  Pertinent Labs:    Component Value Date/Time   CHOL 201 (H) 10/25/2019 1524   TRIG 238 (H) 10/25/2019 1524   CHOLHDL 5.3 (H) 10/25/2019 1524   CREATININE 0.98 10/25/2019 1524    Wt Readings from Last 3 Encounters:  04/09/20 178 lb (80.7 kg)  10/25/19 180 lb (81.6 kg)  07/25/19 176 lb 12.8 oz (80.2 kg)    -----------------------------------------------------------------------------------------  Lipid/Cholesterol, Follow-up  Last lipid panel Other pertinent labs  Lab Results  Component Value Date   CHOL 201 (H) 10/25/2019   HDL 38 (L) 10/25/2019   [place]  121 (H) 10/25/2019   TRIG 238 (H) 10/25/2019   CHOLHDL 5.3 (H) 10/25/2019   Lab Results  Component Value Date   ALT 16 10/25/2019   AST 20 10/25/2019   PLT 303 10/25/2019   TSH 1.040 10/25/2019     He was last seen for this 5 months ago.  Management since that visit includes recommend reducing saturated fats in diet.  He reports good compliance with treatment. He is not having side effects.   Symptoms: No chest pain No chest pressure/discomfort  No dyspnea No lower extremity edema  No numbness or tingling of extremity No orthopnea  No  palpitations No paroxysmal nocturnal dyspnea  No speech difficulty No syncope   Current diet: well balanced Current exercise: none  The ASCVD Risk score (Halliday., et al., 2013) failed to calculate for the following reasons:   The 2013 ASCVD risk score is only valid for ages 8 to 78  ---------------------------------------------------------------------------------------------------  Past Medical History:  Diagnosis Date  . BPH (benign prostatic hyperplasia)   . Cancer [facility]) 2003   prostate  . Cancer ([place]) 12/15/2015   SCC  . Gout   . History of measles   . History of mumps   . Hyperlipidemia   . Hypertension    Past Surgical History:  Procedure Laterality Date  . CATARACT EXTRACTION W/PHACO Right 12/16/2014   Procedure: CATARACT EXTRACTION PHACO AND INTRAOCULAR LENS PLACEMENT ([place]);  Surgeon: Estill Cotta, MD;  Location: [facility] ORS;  Service: Ophthalmology;  Laterality: Right;  [place]   1:12   . CATARACT EXTRACTION W/PHACO Left 04/21/2016   Procedure: CATARACT EXTRACTION PHACO AND INTRAOCULAR LENS PLACEMENT ([facility]);  Surgeon: Estill Cotta, MD;  Location: [facility] ORS;  Service: Ophthalmology;  Laterality: Left;  [place] 01:27 AP% 21.8 CDE 34.78 Fluid pack lot # [id number]  . COLONOSCOPY  04/11/2007   Dr. Donnella Sham, [facility]. Hyperplastic polyps; radiation colitis, multiple colonic angioectasias  . LEG / ANKLE SOFT TISSUE BIOPSY    . PROSTATE SURGERY  02/2002   seed implant  . SHOULDER ARTHROSCOPY W/ ROTATOR CUFF REPAIR Right 05/03/2010  . SQUAMOUS CELL CARCINOMA EXCISION  12/14/2016   on right cheek   Social History   Socioeconomic History  . Marital status: Married    Spouse name: Not on file  . Number of children: 3  . Years of education: Not on file  . Highest education level: Some college, no degree  Occupational History  . Occupation: Retired  Tobacco Use  . Smoking status: Never Smoker  . Smokeless tobacco: Never Used  Vaping Use  . Vaping Use: Never used  Substance  and Sexual Activity  . Alcohol use: Yes    Alcohol/week: 7.0 standard drinks    Types: 7 Cans of beer per week    Comment: 1 beer a night  . Drug use: No  . Sexual activity: Not on file  Other Topics Concern  . Not on file  Social History Narrative  . Not on file   Social Determinants of Health   Financial Resource Strain: Low Risk   . Difficulty of Paying Living Expenses: Not hard at all  Food Insecurity: No Food Insecurity  . Worried About Charity fundraiser in the Last Year: Never true  . Ran Out of Food in the Last Year: Never true  Transportation Needs: No Transportation Needs  . Lack of Transportation (Medical): No  . Lack of Transportation (Non-Medical): No  Physical Activity: Inactive  . Days of Exercise per Week:  0 days  . Minutes of Exercise per Session: 0 min  Stress: No Stress Concern Present  . Feeling of Stress : Not at all  Social Connections: Moderately Isolated  . Frequency of Communication with Friends and Family: More than three times a week  . Frequency of Social Gatherings with Friends and Family: Three times a week  . Attends Religious Services: Never  . Active Member of Clubs or Organizations: No  . Attends Archivist Meetings: Never  . Marital Status: Married  Human resources officer Violence: Not At Risk  . Fear of Current or Ex-Partner: No  . Emotionally Abused: No  . Physically Abused: No  . Sexually Abused: No   Family Status  Relation Name Status  . Mother  Deceased       died at 46, no health issues  . Father  Deceased at age 72       Cause of death: Advanced age  . Brother  Deceased at age 54       died from massive heart attack  . Brother  Alive  . Neg Hx  (Not Specified)   Family History  Problem Relation Age of Onset  . Stroke Father   . Cancer Brother        of eye, removed eyeball  . Diabetes Neg Hx   . Heart disease Neg Hx    No Known Allergies  Patient Care Team: Birdie Sons, MD as PCP - General (Family  Medicine) Dingeldein, Remo Lipps, MD as Consulting Physician (Ophthalmology) Jannet Mantis, MD as Consulting Physician (Dermatology)   Medications: Outpatient Medications Prior to Visit  Medication Sig  . amLODipine (NORVASC) 10 MG tablet TAKE 1 TABLET (10 MG TOTAL) BY MOUTH DAILY.  Marland Kitchen aspirin 81 MG tablet Take 81 mg by mouth daily.  . hydrochlorothiazide (HYDRODIURIL) 25 MG tablet Take 1 tablet (25 mg total) by mouth daily.  . metoprolol succinate (TOPROL-XL) 50 MG 24 hr tablet Take 1 tablet (50 mg total) by mouth daily. Take with or immediately following a meal.  . Multiple Vitamin (MULTI-VITAMINS PO) Take 1 tablet by mouth once.  . Omega-3 Fatty Acids (FISH OIL PO) Take 1 capsule by mouth once. 4000 mg  . Propylene Glycol-Glycerin (SOOTHE) 0.6-0.6 % SOLN Place 3-4 drops into both eyes daily. Soothe  . tamsulosin (FLOMAX) 0.4 MG CAPS capsule Take 1 capsule (0.4 mg total) by mouth daily.  . [DISCONTINUED] amoxicillin-clavulanate (AUGMENTIN) 875-125 MG tablet Take 1 tablet by mouth 2 (two) times daily. (Patient not taking: No sig reported)  . [DISCONTINUED] glucosamine-chondroitin 500-400 MG tablet Take 1 tablet by mouth daily. (Patient not taking: No sig reported)   No facility-administered medications prior to visit.    Review of Systems  Constitutional: Negative for appetite change, chills, fatigue and fever.  HENT: Positive for dental problem. Negative for congestion, ear pain, hearing loss, nosebleeds and trouble swallowing.   Eyes: Negative for pain and visual disturbance.  Respiratory: Negative for cough, chest tightness and shortness of breath.   Cardiovascular: Negative for chest pain, palpitations and leg swelling.  Gastrointestinal: Negative for abdominal pain, blood in stool, constipation, diarrhea, nausea and vomiting.  Endocrine: Negative for polydipsia, polyphagia and polyuria.  Genitourinary: Negative for dysuria and flank pain.  Musculoskeletal: Positive for  arthralgias and joint swelling. Negative for back pain, myalgias and neck stiffness.  Skin: Negative for color change, rash and wound.  Neurological: Negative for dizziness, tremors, seizures, speech difficulty, weakness, light-headedness and headaches.  Psychiatric/Behavioral: Negative for  behavioral problems, confusion, decreased concentration, dysphoric mood and sleep disturbance. The patient is not nervous/anxious.   All other systems reviewed and are negative.     Objective    BP 140/60   Pulse 65   Temp 98.6 F (37 C) (Temporal)   Resp 16   Ht 5\' 8"  (1.727 m)   Wt 178 lb (80.7 kg)   BMI 27.06 kg/m    Physical Exam    General Appearance:     Overweight male. Alert, cooperative, in no acute distress, appears stated age  Head:    Normocephalic, without obvious abnormality, atraumatic  Eyes:    PERRL, conjunctiva/corneas clear, EOM's intact, fundi    benign, both eyes       Ears:    Normal TM's and external ear canals, both ears  Nose:   Nares normal, septum midline, mucosa normal, no drainage   or sinus tenderness  Throat:   Lips, mucosa, and tongue normal; teeth and gums normal  Neck:   Supple, symmetrical, trachea midline, no adenopathy;       thyroid:  No enlargement/tenderness/nodules; no carotid   bruit or JVD  Back:     Symmetric, no curvature, ROM normal, no CVA tenderness  Lungs:     Clear to auscultation bilaterally, respirations unlabored  Chest wall:    No tenderness or deformity  Heart:    Normal heart rate. Normal rhythm. No murmurs, rubs, or gallops.  S1 and S2 normal  Abdomen:     Soft, non-tender, bowel sounds active all four quadrants,    no masses, no organomegaly  Genitalia:    deferred  Rectal:    deferred  Extremities:   All extremities are intact. No cyanosis or edema  Pulses:   2+ and symmetric all extremities  Skin:   Skin color, texture, turgor normal, no rashes or lesions  Lymph nodes:   Cervical, supraclavicular, and axillary nodes normal   Neurologic:   CNII-XII intact. Normal strength, sensation and reflexes      throughout     Last depression screening scores PHQ 2/9 Scores 01/17/2020 11/20/2018 07/13/2017  PHQ - 2 Score 0 0 0  PHQ- 9 Score - - 0   Last fall risk screening Fall Risk  01/17/2020  Falls in the past year? 0  Number falls in past yr: 0  Injury with Fall? 0   Last Audit-C alcohol use screening Alcohol Use Disorder Test (AUDIT) 01/17/2020  1. How often do you have a drink containing alcohol? 4  2. How many drinks containing alcohol do you have on a typical day when you are drinking? 0  3. How often do you have six or more drinks on one occasion? 0  AUDIT-C Score 4  4. How often during the last year have you found that you were not able to stop drinking once you had started? 0  5. How often during the last year have you failed to do what was normally expected from you because of drinking? 0  6. How often during the last year have you needed a first drink in the morning to get yourself going after a heavy drinking session? 0  7. How often during the last year have you had a feeling of guilt of remorse after drinking? 0  8. How often during the last year have you been unable to remember what happened the night before because you had been drinking? 0  9. Have you or someone else been injured as a result  of your drinking? 0  10. Has a relative or friend or a doctor or another health worker been concerned about your drinking or suggested you cut down? 0  Alcohol Use Disorder Identification Test Final Score (AUDIT) 4  Alcohol Brief Interventions/Follow-up AUDIT Score <7 follow-up not indicated   A score of 3 or more in women, and 4 or more in men indicates increased risk for alcohol abuse, EXCEPT if all of the points are from question 1   No results found for any visits on 04/09/20.  Assessment & Plan    Routine Health Maintenance and Physical Exam  Exercise Activities and Dietary recommendations Goals     . DIET - INCREASE WATER INTAKE     Recommend increasing water intake to 4 glasses a day.     . Exercise      Recommend to start walking 3 days a week for at least 30 minutes at a time.        Immunization History  Administered Date(s) Administered  . Fluad Quad(high Dose 65+) 12/26/2018  . H1N1 02/08/2008  . Influenza Split 12/22/2009  . Influenza, High Dose Seasonal PF 01/17/2017, 12/22/2017, 12/12/2019  . Influenza-Unspecified 12/24/2017  . PFIZER SARS-COV-2 Vaccination 04/05/2019, 04/26/2019, 01/10/2020  . Pneumococcal Conjugate-13 04/23/2014  . Pneumococcal Polysaccharide-23 01/22/2004  . Tdap 07/17/2015  . Zoster 04/13/2006    Health Maintenance  Topic Date Due  . Samul Dada  07/16/2025  . INFLUENZA VACCINE  Completed  . COVID-19 Vaccine  Completed  . PNA vac Low Risk Adult  Completed    Discussed health benefits of physical activity, and encouraged him to engage in regular exercise appropriate for his age and condition.  1. Essential (primary) hypertension Well controlled.  Continue current medications.   - EKG 12-Lead - CBC - Comprehensive metabolic panel - Lipid panel  2. Pre-diabetes  - Hemoglobin A1c  3. Personal history of prostate cancer Asymptomatic.   4. Prescription for Shingrix. Vaccine not administered in office.   - Zoster Vaccine Adjuvanted [facility]) injection; Inject 0.5 mLs into the muscle once for 1 dose.  Dispense: 0.5 mL; Refill: 0      The entirety of the information documented in the History of Present Illness, Review of Systems and Physical Exam were personally obtained by me. Portions of this information were initially documented by the CMA and reviewed by me for thoroughness and accuracy.      Lelon Huh, MD  [facility] [phone] (phone) [phone] (fax)  [place]

## 2020-04-10 DIAGNOSIS — R7303 Prediabetes: Secondary | ICD-10-CM | POA: Diagnosis not present

## 2020-04-10 DIAGNOSIS — I10 Essential (primary) hypertension: Secondary | ICD-10-CM | POA: Diagnosis not present

## 2020-04-11 LAB — HEMOGLOBIN A1C
Est. average glucose Bld gHb Est-mCnc: 126 mg/dL
Hgb A1c MFr Bld: 6 % — ABNORMAL HIGH (ref 4.8–5.6)

## 2020-04-11 LAB — CBC
Hematocrit: 42.7 % (ref 37.5–51.0)
Hemoglobin: 15.2 g/dL (ref 13.0–17.7)
MCH: 33.6 pg — ABNORMAL HIGH (ref 26.6–33.0)
MCHC: 35.6 g/dL (ref 31.5–35.7)
MCV: 95 fL (ref 79–97)
Platelets: 287 10*3/uL (ref 150–450)
RBC: 4.52 x10E6/uL (ref 4.14–5.80)
RDW: 12.6 % (ref 11.6–15.4)
WBC: 4.8 10*3/uL (ref 3.4–10.8)

## 2020-04-11 LAB — COMPREHENSIVE METABOLIC PANEL
ALT: 10 IU/L (ref 0–44)
AST: 20 IU/L (ref 0–40)
Albumin/Globulin Ratio: 2 (ref 1.2–2.2)
Albumin: 4.5 g/dL (ref 3.6–4.6)
Alkaline Phosphatase: 49 IU/L (ref 44–121)
BUN/Creatinine Ratio: 12 (ref 10–24)
BUN: 11 mg/dL (ref 8–27)
Bilirubin Total: 0.4 mg/dL (ref 0.0–1.2)
CO2: 25 mmol/L (ref 20–29)
Calcium: 9.3 mg/dL (ref 8.6–10.2)
Chloride: 97 mmol/L (ref 96–106)
Creatinine, Ser: 0.93 mg/dL (ref 0.76–1.27)
GFR calc Af Amer: 86 mL/min/{1.73_m2} (ref >59)
GFR calc non Af Amer: 74 mL/min/{1.73_m2} (ref >59)
Globulin, Total: 2.3 g/dL (ref 1.5–4.5)
Glucose: 101 mg/dL — ABNORMAL HIGH (ref 65–99)
Potassium: 3.7 mmol/L (ref 3.5–5.2)
Sodium: 139 mmol/L (ref 134–144)
Total Protein: 6.8 g/dL (ref 6.0–8.5)

## 2020-04-11 LAB — LIPID PANEL
Chol/HDL Ratio: 4.3 ratio (ref 0.0–5.0)
Cholesterol, Total: 177 mg/dL (ref 100–199)
HDL: 41 mg/dL (ref >39)
LDL Chol Calc (NIH): 111 mg/dL — ABNORMAL HIGH (ref 0–99)
Triglycerides: 137 mg/dL (ref 0–149)
VLDL Cholesterol Cal: 25 mg/dL (ref 5–40)

## 2020-04-14 ENCOUNTER — Other Ambulatory Visit: Payer: Self-pay | Admitting: Physician Assistant

## 2020-04-14 DIAGNOSIS — I10 Essential (primary) hypertension: Secondary | ICD-10-CM

## 2020-04-24 ENCOUNTER — Telehealth: Payer: Self-pay | Admitting: Family Medicine

## 2020-04-24 DIAGNOSIS — M109 Gout, unspecified: Secondary | ICD-10-CM

## 2020-04-24 MED ORDER — COLCHICINE 0.6 MG PO TABS: ORAL_TABLET | ORAL | 1 refills | Status: DC

## 2020-04-24 NOTE — Telephone Encounter (Signed)
Pt saw dr Caryn Section on 04-09-2020 and per pt he mention his has gout flare up and per pt the md was going to switch his medication to something else. [facility] [address]

## 2020-05-08 ENCOUNTER — Other Ambulatory Visit: Payer: Self-pay | Admitting: Physician Assistant

## 2020-05-08 DIAGNOSIS — I10 Essential (primary) hypertension: Secondary | ICD-10-CM

## 2020-05-09 ENCOUNTER — Other Ambulatory Visit: Payer: Self-pay | Admitting: Physician Assistant

## 2020-10-08 ENCOUNTER — Telehealth: Payer: Self-pay

## 2020-10-08 DIAGNOSIS — I10 Essential (primary) hypertension: Secondary | ICD-10-CM

## 2020-10-08 MED ORDER — HYDROCHLOROTHIAZIDE 25 MG PO TABS: 25.0000 mg | ORAL_TABLET | Freq: Every day | ORAL | 1 refills | Status: DC

## 2020-10-08 NOTE — Telephone Encounter (Signed)
[facility] faxed refill request for the following medications:  hydrochlorothiazide (HYDRODIURIL) 25 MG tablet  Please advise.

## 2020-10-10 ENCOUNTER — Ambulatory Visit: Payer: Self-pay | Admitting: Family Medicine

## 2020-10-29 ENCOUNTER — Other Ambulatory Visit: Payer: Self-pay | Admitting: Family Medicine

## 2020-10-29 DIAGNOSIS — I10 Essential (primary) hypertension: Secondary | ICD-10-CM

## 2020-10-29 NOTE — Telephone Encounter (Signed)
Requested Prescriptions  Pending Prescriptions Disp Refills  . metoprolol succinate (TOPROL-XL) 50 MG 24 hr tablet [Pharmacy Med Name: METOPROLOL SUCC ER 50 MG TAB] 90 tablet 0    Sig: TAKE 1 TABLET BY MOUTH EVERY DAY WITH OR IMMEDIATELY FOLLOWING A MEAL     Cardiovascular:  Beta Blockers Failed - 10/29/2020  1:29 AM      Failed - Last BP in normal range    BP Readings from Last 1 Encounters:  04/09/20 140/60         Failed - Valid encounter within last 6 months    Recent Outpatient Visits          6 months ago Essential (primary) hypertension   [facility] Birdie Sons, MD   1 year ago Essential (primary) hypertension   [facility], Wendee Beavers, PA-C   1 year ago Dental abscess   [facility], Wendee Beavers, [place]   1 year ago Gouty arthropathy   [facility] Carles Collet M, [place]   2 years ago Essential (primary) hypertension   [facility], Kirstie Peri, MD      Future Appointments            In 6 days Fisher, Kirstie Peri, MD [facility]           Passed - Last Heart Rate in normal range    Pulse Readings from Last 1 Encounters:  04/09/20 65         . tamsulosin (FLOMAX) 0.4 MG CAPS capsule [Pharmacy Med Name: TAMSULOSIN HCL 0.4 MG CAPSULE] 90 capsule 1    Sig: TAKE 1 [place]     Urology: Alpha-Adrenergic Blocker Failed - 10/29/2020  1:29 AM      Failed - Last BP in normal range    BP Readings from Last 1 Encounters:  04/09/20 140/60         Passed - Valid encounter within last 12 months    Recent Outpatient Visits          6 months ago Essential (primary) hypertension   [facility] Birdie Sons, MD   1 year ago Essential (primary) hypertension   [facility], Wendee Beavers, [place]   1 year ago Dental abscess   [facility], Wendee Beavers, [place]   1 year ago Gouty arthropathy    [facility] Carles Collet M, [place]   2 years ago Essential (primary) hypertension   [place], Kirstie Peri, MD      Future Appointments            In 6 days Fisher, Kirstie Peri, MD [facility], [place]

## 2020-11-04 ENCOUNTER — Other Ambulatory Visit: Payer: Self-pay

## 2020-11-04 ENCOUNTER — Ambulatory Visit (INDEPENDENT_AMBULATORY_CARE_PROVIDER_SITE_OTHER): Payer: Medicare HMO | Admitting: Family Medicine

## 2020-11-04 ENCOUNTER — Encounter: Payer: Self-pay | Admitting: Family Medicine

## 2020-11-04 VITALS — BP 136/80 | HR 61 | Temp 97.7°F | Resp 16 | Wt 175.0 lb

## 2020-11-04 DIAGNOSIS — I10 Essential (primary) hypertension: Secondary | ICD-10-CM

## 2020-11-04 DIAGNOSIS — R7303 Prediabetes: Secondary | ICD-10-CM

## 2020-11-04 LAB — POCT GLYCOSYLATED HEMOGLOBIN (HGB A1C)
Est. average glucose Bld gHb Est-mCnc: 128
Hemoglobin A1C: 6.1 % — AB (ref 4.0–5.6)

## 2020-11-04 NOTE — Progress Notes (Signed)
Established patient visit   Patient: Mark Hunter   DOB: [birth year]   87 y.o. Male  MRN: [id number] Visit Date: 11/04/2020  Today's healthcare provider: Lelon Huh, MD   Chief Complaint  Patient presents with   Hypertension   Prediabetes   Subjective    HPI  Hypertension, follow-up  BP Readings from Last 3 Encounters:  11/04/20 136/80  04/09/20 140/60  10/25/19 (!) 132/68   Wt Readings from Last 3 Encounters:  11/04/20 175 lb (79.4 kg)  04/09/20 178 lb (80.7 kg)  10/25/19 180 lb (81.6 kg)     He was last seen for hypertension 6 months ago.  BP at that visit was 140/60. Management since that visit includes continuing same medication.  He reports good compliance with treatment. He is not having side effects.  He is following a Regular diet. He is exercising. He does not smoke.  Use of agents associated with hypertension: NSAIDS.   Outside blood pressures are 140/70. Symptoms: No chest pain No chest pressure  No palpitations No syncope  No dyspnea No orthopnea  No paroxysmal nocturnal dyspnea No lower extremity edema   Pertinent labs: Lab Results  Component Value Date   CHOL 177 04/10/2020   HDL 41 04/10/2020   LDLCALC 111 (H) 04/10/2020   TRIG 137 04/10/2020   CHOLHDL 4.3 04/10/2020   Lab Results  Component Value Date   NA 139 04/10/2020   K 3.7 04/10/2020   CREATININE 0.93 04/10/2020   GFRNONAA 74 04/10/2020   GFRAA 86 04/10/2020   GLUCOSE 101 (H) 04/10/2020     The ASCVD Risk score (Goff DC Jr., et al., 2013) failed to calculate for the following reasons:   The 2013 ASCVD risk score is only valid for ages 91 to 66   ---------------------------------------------------------------------------------------------------   Prediabetes, Follow-up  Lab Results  Component Value Date   HGBA1C 6.1 (A) 11/04/2020   HGBA1C 6.0 (H) 04/10/2020   HGBA1C 6.1 (H) 10/25/2019   GLUCOSE 101 (H) 04/10/2020   GLUCOSE 106 (H) 10/25/2019   GLUCOSE 151  (H) 04/12/2018    Last seen for for this 6 months ago.  Management since that visit includes advisng patient to continue lifestyle modifications. Current symptoms include paresthesia of the feet and have been unchanged.  Prior visit with dietician: no Current diet: in general, an "unhealthy" diet Current exercise: walking  Pertinent Labs:    Component Value Date/Time   CHOL 177 04/10/2020 0805   TRIG 137 04/10/2020 0805   CHOLHDL 4.3 04/10/2020 0805   CREATININE 0.93 04/10/2020 0805    Wt Readings from Last 3 Encounters:  11/04/20 175 lb (79.4 kg)  04/09/20 178 lb (80.7 kg)  10/25/19 180 lb (81.6 kg)    -----------------------------------------------------------------------------------------      Medications: Outpatient Medications Prior to Visit  Medication Sig   amLODipine (NORVASC) 10 MG tablet TAKE 1 TABLET (10 MG TOTAL) BY MOUTH DAILY.   aspirin 81 MG tablet Take 81 mg by mouth daily.   colchicine 0.6 MG tablet Take 2 tablets (1.2 mg) on first day of gout flare.  Then, take 1 tablet (0.'6mg'$ ) daily until resolved.   hydrochlorothiazide (HYDRODIURIL) 25 MG tablet Take 1 tablet (25 mg total) by mouth daily.   metoprolol succinate (TOPROL-XL) 50 MG 24 hr tablet TAKE 1 TABLET BY MOUTH EVERY DAY WITH OR IMMEDIATELY FOLLOWING A MEAL   Multiple Vitamin (MULTI-VITAMINS PO) Take 1 tablet by mouth once.   Omega-3 Fatty Acids (FISH OIL  PO) Take 1 capsule by mouth once. 4000 mg   Propylene Glycol-Glycerin (SOOTHE) 0.6-0.6 % SOLN Place 3-4 drops into both eyes daily. Soothe   tamsulosin (FLOMAX) 0.4 MG CAPS capsule TAKE 1 CAPSULE BY MOUTH EVERY DAY   No facility-administered medications prior to visit.    Review of Systems  Constitutional:  Negative for appetite change, chills and fever.  Respiratory:  Negative for chest tightness, shortness of breath and wheezing.   Cardiovascular:  Negative for chest pain and palpitations.  Gastrointestinal:  Negative for abdominal pain,  nausea and vomiting.  Neurological:  Positive for numbness (in feet).      Objective    BP 136/80 (BP Location: Left Arm, Patient Position: Sitting, Cuff Size: Normal)   Pulse 61   Temp 97.7 F (36.5 C) (Temporal)   Resp 16   Wt 175 lb (79.4 kg)   BMI 26.61 kg/m     Physical Exam  General appearance:  Well developed, well nourished male, cooperative and in no acute distress Head: Normocephalic, without obvious abnormality, atraumatic Respiratory: Respirations even and unlabored, normal respiratory rate Extremities: All extremities are intact.  Skin: Skin color, texture, turgor normal. No rashes seen  Psych: Appropriate mood and affect. Neurologic: Mental status: Alert, oriented to person, place, and time, thought content appropriate.   Results for orders placed or performed in visit on 11/04/20  POCT HgB A1C  Result Value Ref Range   Hemoglobin A1C 6.1 (A) 4.0 - 5.6 %   Est. average glucose Bld gHb Est-mCnc 128     Assessment & Plan     1. Pre-diabetes Very well controlled. Follow up 6 months. He is having mild numbness in his feet and recommend he try OTC vitamin b12 supplements.   2. Essential (primary) hypertension Well controlled.  Continue current medications.     Future Appointments  Date Time Provider [place]  01/22/2021  2:00 PM [facility] HEALTH ADVISOR [facility]  05/12/2021  9:00 AM Caryn Section, Kirstie Peri, MD [facility]    The entirety of the information documented in the History of Present Illness, Review of Systems and Physical Exam were personally obtained by me. Portions of this information were initially documented by the CMA and reviewed by me for thoroughness and accuracy.           Lelon Huh, MD  [facility] [phone] (phone) [phone] (fax)  [place]

## 2020-11-04 NOTE — Patient Instructions (Signed)
Please review the attached list of medications and notify my office if there are any errors.   Try adding vitamin B-12 1,'000mg'$  once a day for the next month to see if helps with the numbness in your feet.

## 2020-12-03 ENCOUNTER — Other Ambulatory Visit: Payer: Self-pay | Admitting: Family Medicine

## 2021-01-25 ENCOUNTER — Other Ambulatory Visit: Payer: Self-pay | Admitting: Family Medicine

## 2021-01-25 DIAGNOSIS — I10 Essential (primary) hypertension: Secondary | ICD-10-CM

## 2021-01-25 NOTE — Telephone Encounter (Signed)
Requested Prescriptions  Pending Prescriptions Disp Refills  . metoprolol succinate (TOPROL-XL) 50 MG 24 hr tablet [Pharmacy Med Name: METOPROLOL SUCC ER 50 MG TAB] 90 tablet 0    Sig: TAKE 1 TABLET BY MOUTH EVERY DAY WITH OR IMMEDIATELY FOLLOWING A MEAL     Cardiovascular:  Beta Blockers Passed - 01/25/2021 11:07 AM      Passed - Last BP in normal range    BP Readings from Last 1 Encounters:  11/04/20 136/80         Passed - Last Heart Rate in normal range    Pulse Readings from Last 1 Encounters:  11/04/20 61         Passed - Valid encounter within last 6 months    Recent Outpatient Visits          2 months ago Pre-diabetes   [facility] Birdie Sons, MD   9 months ago Essential (primary) hypertension   [facility] Fisher, Kirstie Peri, MD   1 year ago Essential (primary) hypertension   [place], Wendee Bea[place], [place]   1 year ago Dental abscess   [place], [place], [place]   2 years ago Gouty arthropathy   [place], [place], [place]

## 2021-04-09 ENCOUNTER — Other Ambulatory Visit: Payer: Self-pay | Admitting: Family Medicine

## 2021-04-09 DIAGNOSIS — I10 Essential (primary) hypertension: Secondary | ICD-10-CM

## 2021-04-09 NOTE — Telephone Encounter (Signed)
Requested medications are due for refill today.  yes  Requested medications are on the active medications list.  yes  Last refill. 10/08/2020 #90 with 1 refill  Future visit scheduled.   Yes - 05/12/2021  Notes to clinic.  Failed protocol d/t expired labs.    Requested Prescriptions  Pending Prescriptions Disp Refills   hydrochlorothiazide (HYDRODIURIL) 25 MG tablet [Pharmacy Med Name: HYDROCHLOROTHIAZIDE 25 MG TAB] 90 tablet 1    Sig: Take 1 tablet (25 mg total) by mouth daily.     Cardiovascular: Diuretics - Thiazide Failed - 04/09/2021  1:32 AM      Failed - Ca in normal range and within 360 days    Calcium  Date Value Ref Range Status  04/10/2020 9.3 8.6 - 10.2 mg/dL Final          Failed - Cr in normal range and within 360 days    Creatinine, Ser  Date Value Ref Range Status  04/10/2020 0.93 0.76 - 1.27 mg/dL Final          Failed - K in normal range and within 360 days    Potassium  Date Value Ref Range Status  04/10/2020 3.7 3.5 - 5.2 mmol/L Final          Failed - Na in normal range and within 360 days    Sodium  Date Value Ref Range Status  04/10/2020 139 134 - 144 mmol/L Final          Passed - Last BP in normal range    BP Readings from Last 1 Encounters:  11/04/20 136/80          Passed - Valid encounter within last 6 months    Recent Outpatient Visits           5 months ago Pre-diabetes   [facility] Birdie Sons, MD   1 year ago Essential (primary) hypertension   [facility] Birdie Sons, MD   1 year ago Essential (primary) hypertension   [place], Wendee Beavers, [place]   1 year ago Dental abscess   [place], [place], [place]   2 years ago Gouty arthropathy   [place], [place], [place]

## 2021-04-23 ENCOUNTER — Other Ambulatory Visit: Payer: Self-pay | Admitting: Family Medicine

## 2021-04-23 NOTE — Telephone Encounter (Signed)
Requested Prescriptions  Pending Prescriptions Disp Refills   tamsulosin (FLOMAX) 0.4 MG CAPS capsule [Pharmacy Med Name: TAMSULOSIN HCL 0.4 MG CAPSULE] 90 capsule 1    Sig: TAKE 1 CAPSULE BY MOUTH EVERY DAY     Urology: Alpha-Adrenergic Blocker Passed - 04/23/2021  1:30 AM      Passed - Last BP in normal range    BP Readings from Last 1 Encounters:  11/04/20 136/80         Passed - Valid encounter within last 12 months    Recent Outpatient Visits          5 months ago Pre-diabetes   [facility] Birdie Sons, MD   1 year ago Essential (primary) hypertension   [facility] Birdie Sons, MD   1 year ago Essential (primary) hypertension   [place], Wendee Beavers, [place]   1 year ago Dental abscess   [place], [place], [place]   2 years ago Gouty arthropathy   [place], [place], [place]

## 2021-04-24 ENCOUNTER — Other Ambulatory Visit: Payer: Self-pay | Admitting: Family Medicine

## 2021-04-24 DIAGNOSIS — I10 Essential (primary) hypertension: Secondary | ICD-10-CM

## 2021-04-24 NOTE — Telephone Encounter (Signed)
Requested Prescriptions  Pending Prescriptions Disp Refills   metoprolol succinate (TOPROL-XL) 50 MG 24 hr tablet [Pharmacy Med Name: METOPROLOL SUCC ER 50 MG TAB] 90 tablet 0    Sig: TAKE 1 TABLET BY MOUTH EVERY DAY WITH OR IMMEDIATELY FOLLOWING A MEAL     Cardiovascular:  Beta Blockers Passed - 04/24/2021  1:25 AM      Passed - Last BP in normal range    BP Readings from Last 1 Encounters:  11/04/20 136/80         Passed - Last Heart Rate in normal range    Pulse Readings from Last 1 Encounters:  11/04/20 61         Passed - Valid encounter within last 6 months    Recent Outpatient Visits          5 months ago Pre-diabetes   [facility] Birdie Sons, MD   1 year ago Essential (primary) hypertension   [facility] Birdie Sons, MD   1 year ago Essential (primary) hypertension   [place], Wendee Bea[place], [place]   1 year ago Dental abscess   [place], Wendee Bea[place], [place]   2 years ago Gouty arthropathy   [place], [place], [place]

## 2021-05-12 ENCOUNTER — Encounter: Payer: Self-pay | Admitting: Family Medicine

## 2021-05-12 ENCOUNTER — Other Ambulatory Visit: Payer: Self-pay

## 2021-05-12 ENCOUNTER — Ambulatory Visit (INDEPENDENT_AMBULATORY_CARE_PROVIDER_SITE_OTHER): Payer: Medicare HMO | Admitting: Family Medicine

## 2021-05-12 VITALS — BP 135/63 | HR 71 | Temp 98.7°F | Resp 14 | Ht 68.0 in | Wt 175.0 lb

## 2021-05-12 DIAGNOSIS — I10 Essential (primary) hypertension: Secondary | ICD-10-CM

## 2021-05-12 DIAGNOSIS — Z0000 Encounter for general adult medical examination without abnormal findings: Secondary | ICD-10-CM | POA: Diagnosis not present

## 2021-05-12 DIAGNOSIS — R7303 Prediabetes: Secondary | ICD-10-CM

## 2021-05-12 NOTE — Progress Notes (Signed)
I,Roshena L Chambers,acting as a scribe for Lelon Huh, MD.,have documented all relevant documentation on the behalf of Lelon Huh, MD,as directed by  Lelon Huh, MD while in the presence of Lelon Huh, MD.    Complete Physical Exam     Patient: Mark Hunter, Male    DOB: 04-24-1931, 88 y.o.   MRN: [id number] Visit Date: 05/12/2021  Today's Provider: Lelon Huh, MD   Chief Complaint  Patient presents with   Medicare Wellness   Prediabetes   Hypertension   Subjective    STORY VANVRANKEN is a 88 y.o. male who presents today for his complete physical examination  He reports consuming a general diet. Home exercise routine includes walking. He generally feels fairly well. He reports sleeping fairly well. He does not have additional problems to discuss today.   HPI Prediabetes, Follow-up  Lab Results  Component Value Date   HGBA1C 6.1 (A) 11/04/2020   HGBA1C 6.0 (H) 04/10/2020   HGBA1C 6.1 (H) 10/25/2019   GLUCOSE 101 (H) 04/10/2020   GLUCOSE 106 (H) 10/25/2019   GLUCOSE 151 (H) 04/12/2018     Last seen for for this 6 months ago.  Management since that visit includes advising patient to try OTC Vitamin B12 supplements to help with mild numbness in feet.   Current symptoms include none and have been stable.  Prior visit with dietician: no Current diet: in general, a "healthy" diet   Current exercise: walking  Pertinent Labs:    Component Value Date/Time   CHOL 177 04/10/2020 0805   TRIG 137 04/10/2020 0805   CHOLHDL 4.3 04/10/2020 0805   CREATININE 0.93 04/10/2020 0805    Wt Readings from Last 3 Encounters:  05/12/21 175 lb (79.4 kg)  11/04/20 175 lb (79.4 kg)  04/09/20 178 lb (80.7 kg)    -----------------------------------------------------------------------------------------   Hypertension, follow-up  BP Readings from Last 3 Encounters:  05/12/21 135/63  11/04/20 136/80  04/09/20 140/60   Wt Readings from Last 3 Encounters:   05/12/21 175 lb (79.4 kg)  11/04/20 175 lb (79.4 kg)  04/09/20 178 lb (80.7 kg)     He was last seen for hypertension 6 months ago.  BP at that visit was 136/80. Management since that visit includes continue same medication.  He reports good compliance with treatment. He is not having side effects.  He is following a Regular diet. He is exercising. He does not smoke.  Use of agents associated with hypertension: NSAIDS.   Outside blood pressures are 387 systolic. Patient unsure of diastolic reading. Symptoms: No chest pain No chest pressure  No palpitations No syncope  No dyspnea No orthopnea  No paroxysmal nocturnal dyspnea No lower extremity edema   Pertinent labs: Lab Results  Component Value Date   CHOL 177 04/10/2020   HDL 41 04/10/2020   LDLCALC 111 (H) 04/10/2020   TRIG 137 04/10/2020   CHOLHDL 4.3 04/10/2020   Lab Results  Component Value Date   NA 139 04/10/2020   K 3.7 04/10/2020   CREATININE 0.93 04/10/2020   GFRNONAA 74 04/10/2020   GLUCOSE 101 (H) 04/10/2020   TSH 1.040 10/25/2019     The ASCVD Risk score (Arnett DK, et al., 2019) failed to calculate for the following reasons:   The 2019 ASCVD risk score is only valid for ages 93 to 44   ---------------------------------------------------------------------------------------------------    Medications: Outpatient Medications Prior to Visit  Medication Sig   amLODipine (NORVASC) 10 MG tablet TAKE 1  TABLET (10 MG TOTAL) BY MOUTH DAILY.   aspirin 81 MG tablet Take 81 mg by mouth daily.   colchicine 0.6 MG tablet Take 2 tablets (1.2 mg) on first day of gout flare.  Then, take 1 tablet (0.6mg ) daily until resolved.   hydrochlorothiazide (HYDRODIURIL) 25 MG tablet TAKE 1 TABLET (25 MG TOTAL) BY MOUTH DAILY.   metoprolol succinate (TOPROL-XL) 50 MG 24 hr tablet TAKE 1 TABLET BY MOUTH EVERY DAY WITH OR IMMEDIATELY FOLLOWING A MEAL   Multiple Vitamin (MULTI-VITAMINS PO) Take 1 tablet by mouth once.    Omega-3 Fatty Acids (FISH OIL PO) Take 1 capsule by mouth once. 4000 mg   Propylene Glycol-Glycerin (SOOTHE) 0.6-0.6 % SOLN Place 3-4 drops into both eyes daily. Soothe   tamsulosin (FLOMAX) 0.4 MG CAPS capsule TAKE 1 CAPSULE BY MOUTH EVERY DAY   vitamin B-12 (CYANOCOBALAMIN) 1000 MCG tablet Take 1,000 mcg by mouth daily.   No facility-administered medications prior to visit.    Allergies  Allergen Reactions   Indomethacin     INTERACTED WITH HIS METOPROLOL WHEN TAKEN IN 2021    Patient Care Team: Birdie Sons, MD as PCP - General (Family Medicine) Dingeldein, Remo Lipps, MD as Consulting Physician (Ophthalmology) Ree Edman, MD as Consulting Physician (Dermatology)  Review of Systems  Constitutional:  Negative for appetite change, chills, fatigue and fever.  HENT:  Negative for congestion, ear pain, hearing loss, nosebleeds and trouble swallowing.   Eyes:  Negative for pain and visual disturbance.  Respiratory:  Negative for cough, chest tightness and shortness of breath.   Cardiovascular:  Negative for chest pain, palpitations and leg swelling.  Gastrointestinal:  Negative for abdominal pain, blood in stool, constipation, diarrhea, nausea and vomiting.  Endocrine: Negative for polydipsia, polyphagia and polyuria.  Genitourinary:  Negative for dysuria and flank pain.  Musculoskeletal:  Negative for arthralgias, back pain, joint swelling, myalgias and neck stiffness.  Skin:  Negative for color change, rash and wound.  Neurological:  Positive for dizziness and numbness. Negative for tremors, seizures, speech difficulty, weakness, light-headedness and headaches.  Psychiatric/Behavioral:  Negative for behavioral problems, confusion, decreased concentration, dysphoric mood and sleep disturbance. The patient is not nervous/anxious.   All other systems reviewed and are negative.     Objective    Vitals: BP 135/63 (BP Location: Right Arm, Patient Position: Sitting, Cuff Size:  Normal)    Pulse 71    Temp 98.7 F (37.1 C) (Oral)    Resp 14    Ht 5\' 8"  (1.727 m)    Wt 175 lb (79.4 kg)    SpO2 98% Comment: room air   BMI 26.61 kg/m   Today's Vitals   05/12/21 0904 05/12/21 0909  BP: (!) 149/61 135/63  Pulse: 71   Resp: 14   Temp: 98.7 F (37.1 C)   TempSrc: Oral   SpO2: 98%   Weight: 175 lb (79.4 kg)   Height: 5\' 8"  (1.727 m)    Body mass index is 26.61 kg/m.   Physical Exam   General Appearance:     Well developed, well nourished male. Alert, cooperative, in no acute distress, appears stated age  Head:    Normocephalic, without obvious abnormality, atraumatic  Eyes:    PERRL, conjunctiva/corneas clear, EOM's intact, fundi    benign, both eyes       Ears:    Normal TM's and external ear canals, both ears  Neck:   Supple, symmetrical, trachea midline, no adenopathy;  thyroid:  No enlargement/tenderness/nodules; no carotid   bruit or JVD  Back:     Symmetric, no curvature, ROM normal, no CVA tenderness  Lungs:     Clear to auscultation bilaterally, respirations unlabored  Chest wall:    No tenderness or deformity  Heart:    Normal heart rate. Normal rhythm. No murmurs, rubs, or gallops.  S1 and S2 normal  Abdomen:     Soft, non-tender, bowel sounds active all four quadrants,    no masses, no organomegaly  Genitalia:    deferred  Rectal:    deferred  Extremities:   All extremities are intact. No cyanosis or edema  Pulses:   2+ and symmetric all extremities  Skin:   Skin color, texture, turgor normal, no rashes or lesions  Lymph nodes:   Cervical, supraclavicular, and axillary nodes normal  Neurologic:   CNII-XII intact. Normal strength, sensation and reflexes      throughout     Assessment & Plan    1. Annual physical exam   2. Essential (primary) hypertension Relatively well controlled.  - Comprehensive metabolic panel - Lipid panel - CBC - EKG 12-Lead  3. Pre-diabetes  - Hemoglobin A1c    The entirety of the information  documented in the History of Present Illness, Review of Systems and Physical Exam were personally obtained by me. Portions of this information were initially documented by the CMA and reviewed by me for thoroughness and accuracy.     Lelon Huh, MD  [facility] [phone] (phone) [phone] (fax)  [place]

## 2021-05-12 NOTE — Patient Instructions (Signed)
Please review the attached list of medications and notify my office if there are any errors.   Try OTC alpha-lipoic acid 600 three a day to see if it helps with neuropathy. If it's not helping after a month you can stop taking it.  

## 2021-05-13 DIAGNOSIS — R7303 Prediabetes: Secondary | ICD-10-CM | POA: Diagnosis not present

## 2021-05-13 DIAGNOSIS — I10 Essential (primary) hypertension: Secondary | ICD-10-CM | POA: Diagnosis not present

## 2021-05-13 NOTE — Progress Notes (Signed)
Annual Wellness Visit     Patient: Mark Hunter, Male    DOB: 03-10-1932, 88 y.o.   MRN: [id number] Visit Date: 05/12/2021  Today's Provider: Lelon Huh, MD    Subjective    Mark Hunter is a 88 y.o. male who presents today for his Annual Wellness Visit.  Medications: Outpatient Medications Prior to Visit  Medication Sig   amLODipine (NORVASC) 10 MG tablet TAKE 1 TABLET (10 MG TOTAL) BY MOUTH DAILY.   aspirin 81 MG tablet Take 81 mg by mouth daily.   colchicine 0.6 MG tablet Take 2 tablets (1.2 mg) on first day of gout flare.  Then, take 1 tablet (0.6mg ) daily until resolved.   hydrochlorothiazide (HYDRODIURIL) 25 MG tablet TAKE 1 TABLET (25 MG TOTAL) BY MOUTH DAILY.   metoprolol succinate (TOPROL-XL) 50 MG 24 hr tablet TAKE 1 TABLET BY MOUTH EVERY DAY WITH OR IMMEDIATELY FOLLOWING A MEAL   Multiple Vitamin (MULTI-VITAMINS PO) Take 1 tablet by mouth once.   Omega-3 Fatty Acids (FISH OIL PO) Take 1 capsule by mouth once. 4000 mg   Propylene Glycol-Glycerin (SOOTHE) 0.6-0.6 % SOLN Place 3-4 drops into both eyes daily. Soothe   tamsulosin (FLOMAX) 0.4 MG CAPS capsule TAKE 1 CAPSULE BY MOUTH EVERY DAY   vitamin B-12 (CYANOCOBALAMIN) 1000 MCG tablet Take 1,000 mcg by mouth daily.   No facility-administered medications prior to visit.    Allergies  Allergen Reactions   Indomethacin     INTERACTED WITH HIS METOPROLOL WHEN TAKEN IN 2021    Patient Care Team: Birdie Sons, MD as PCP - General (Family Medicine) Dingeldein, Remo Lipps, MD as Consulting Physician (Ophthalmology) Ree Edman, MD as Consulting Physician (Dermatology)    Objective      Most recent functional status assessment: In your present state of health, do you have any difficulty performing the following activities: 05/12/2021  Hearing? N  Vision? N  Difficulty concentrating or making decisions? N  Walking or climbing stairs? N  Dressing or bathing? N  Doing errands, shopping? N   Some recent data might be hidden   Most recent fall risk assessment: Fall Risk  05/12/2021  Falls in the past year? 0  Number falls in past yr: 0  Injury with Fall? 0  Risk for fall due to : No Fall Risks  Follow up Falls evaluation completed    Most recent depression screenings: PHQ 2/9 Scores 05/12/2021 11/04/2020  PHQ - 2 Score 0 0  PHQ- 9 Score 0 0   Most recent cognitive screening: 6CIT Screen 01/17/2020  What Year? 0 points  What month? 0 points  What time? 3 points  Count back from 20 0 points  Months in reverse 0 points  Repeat phrase 0 points  Total Score 3   Most recent Audit-C alcohol use screening Alcohol Use Disorder Test (AUDIT) 01/17/2020  1. How often do you have a drink containing alcohol? 4  2. How many drinks containing alcohol do you have on a typical day when you are drinking? 0  3. How often do you have six or more drinks on one occasion? 0  AUDIT-C Score 4  4. How often during the last year have you found that you were not able to stop drinking once you had started? 0  5. How often during the last year have you failed to do what was normally expected from you because of drinking? 0  6. How often during the last year have you needed  a first drink in the morning to get yourself going after a heavy drinking session? 0  7. How often during the last year have you had a feeling of guilt of remorse after drinking? 0  8. How often during the last year have you been unable to remember what happened the night before because you had been drinking? 0  9. Have you or someone else been injured as a result of your drinking? 0  10. Has a relative or friend or a doctor or another health worker been concerned about your drinking or suggested you cut down? 0  Alcohol Use Disorder Identification Test Final Score (AUDIT) 4  Alcohol Brief Interventions/Follow-up AUDIT Score <7 follow-up not indicated   A score of 3 or more in women, and 4 or more in men indicates increased  risk for alcohol abuse, EXCEPT if all of the points are from question 1   No results found for any visits on 05/12/21.  Assessment & Plan     Annual wellness visit done today including the all of the following: Reviewed patient's Family Medical History Reviewed and updated list of patient's medical providers Assessment of cognitive impairment was done Assessed patient's functional ability Established a written schedule for health screening [place] Completed and Reviewed  Exercise Activities and Dietary recommendations  Goals      DIET - INCREASE WATER INTAKE     Recommend increasing water intake to 4 glasses a day.      Exercise      Recommend to start walking 3 days a week for at least 30 minutes at a time.         Immunization History  Administered Date(s) Administered   Fluad Quad(high Dose 65+) 12/26/2018   H1N1 02/08/2008   Influenza Split 12/22/2009   Influenza, High Dose Seasonal PF 01/17/2017, 12/22/2017, 12/12/2019, 12/18/2020   Influenza-Unspecified 12/24/2017   PFIZER Comirnaty(Gray Top)Covid-19 Tri-Sucrose Vaccine 07/24/2020   PFIZER(Purple Top)SARS-COV-2 Vaccination 04/05/2019, 04/26/2019, 01/10/2020   Pfizer Covid-19 Vaccine Bivalent Booster 71yrs & up 01/07/2021   Pneumococcal Conjugate-13 04/23/2014   Pneumococcal Polysaccharide-23 01/22/2004   Tdap 07/17/2015   Zoster Recombinat (Shingrix) 06/16/2020, 11/20/2020   Zoster, Live 04/13/2006    Health Maintenance  Topic Date Due   TETANUS/TDAP  07/16/2025   Pneumonia Vaccine 42+ Years old  Completed   INFLUENZA VACCINE  Completed   COVID-19 Vaccine  Completed   Zoster Vaccines- Shingrix  Completed   HPV VACCINES  Aged Out     Discussed health benefits of physical activity, and encouraged him to engage in regular exercise appropriate for his age and condition.        The entirety of the information documented in the History of Present Illness, Review of Systems and Physical  Exam were personally obtained by me. Portions of this information were initially documented by the CMA and reviewed by me for thoroughness and accuracy.     Lelon Huh, MD  [facility] [phone] (phone) [phone] (fax)  [place]

## 2021-05-14 LAB — CBC
Hematocrit: 43.5 % (ref 37.5–51.0)
Hemoglobin: 15.3 g/dL (ref 13.0–17.7)
MCH: 33.6 pg — ABNORMAL HIGH (ref 26.6–33.0)
MCHC: 35.2 g/dL (ref 31.5–35.7)
MCV: 95 fL (ref 79–97)
Platelets: 274 10*3/uL (ref 150–450)
RBC: 4.56 x10E6/uL (ref 4.14–5.80)
RDW: 12.4 % (ref 11.6–15.4)
WBC: 4.9 10*3/uL (ref 3.4–10.8)

## 2021-05-14 LAB — COMPREHENSIVE METABOLIC PANEL
ALT: 10 IU/L (ref 0–44)
AST: 19 IU/L (ref 0–40)
Albumin/Globulin Ratio: 2.1 (ref 1.2–2.2)
Albumin: 4.6 g/dL (ref 3.6–4.6)
Alkaline Phosphatase: 44 IU/L (ref 44–121)
BUN/Creatinine Ratio: 11 (ref 10–24)
BUN: 10 mg/dL (ref 8–27)
Bilirubin Total: 0.7 mg/dL (ref 0.0–1.2)
CO2: 25 mmol/L (ref 20–29)
Calcium: 9.6 mg/dL (ref 8.6–10.2)
Chloride: 99 mmol/L (ref 96–106)
Creatinine, Ser: 0.92 mg/dL (ref 0.76–1.27)
Globulin, Total: 2.2 g/dL (ref 1.5–4.5)
Glucose: 113 mg/dL — ABNORMAL HIGH (ref 70–99)
Potassium: 4.1 mmol/L (ref 3.5–5.2)
Sodium: 140 mmol/L (ref 134–144)
Total Protein: 6.8 g/dL (ref 6.0–8.5)
eGFR: 81 mL/min/{1.73_m2} (ref >59)

## 2021-05-14 LAB — LIPID PANEL
Chol/HDL Ratio: 4.6 ratio (ref 0.0–5.0)
Cholesterol, Total: 188 mg/dL (ref 100–199)
HDL: 41 mg/dL (ref >39)
LDL Chol Calc (NIH): 124 mg/dL — ABNORMAL HIGH (ref 0–99)
Triglycerides: 127 mg/dL (ref 0–149)
VLDL Cholesterol Cal: 23 mg/dL (ref 5–40)

## 2021-05-14 LAB — HEMOGLOBIN A1C
Est. average glucose Bld gHb Est-mCnc: 128 mg/dL
Hgb A1c MFr Bld: 6.1 % — ABNORMAL HIGH (ref 4.8–5.6)

## 2021-07-06 ENCOUNTER — Ambulatory Visit: Payer: Self-pay | Admitting: *Deleted

## 2021-07-06 ENCOUNTER — Other Ambulatory Visit: Payer: Self-pay | Admitting: Family Medicine

## 2021-07-06 DIAGNOSIS — M109 Gout, unspecified: Secondary | ICD-10-CM

## 2021-07-06 DIAGNOSIS — I10 Essential (primary) hypertension: Secondary | ICD-10-CM

## 2021-07-06 NOTE — Telephone Encounter (Signed)
3rd attempt to contact pt.   Left voicemail to call back if he had any questions or problems with getting his prescription.   It was sent to the pharmacy as requested this morning.   ? ?Will close his request and send to [facility] per protocol. ?

## 2021-07-06 NOTE — Telephone Encounter (Signed)
Called pt again and LM on VM. Pt advised colchicine was sent in this am to requested pharmacy. Advised to call back for any questions. ?

## 2021-07-06 NOTE — Telephone Encounter (Signed)
I returned pt's call.   Left a voicemail to call back.   He was requesting a rush on the colchicine for his gout flare up.  The rx was sent in at 7:58 AM this morning to [facility] In [place]. ?

## 2021-07-27 ENCOUNTER — Ambulatory Visit: Payer: Self-pay

## 2021-07-27 NOTE — Telephone Encounter (Signed)
?  Chief Complaint: numbness and tingling both feet ?Symptoms: numbness and tingling both feet ?Frequency: constant for a year now ?Pertinent Negatives: Patient denies headache, dizziness, vision problems or changes in speech ?Disposition: '[]'$ ED /'[]'$ Urgent Care (no appt availability in office) / '[x]'$ Appointment(In office/virtual)/ '[]'$  [facility]/ '[]'$ Home Care/ '[]'$ Refused Recommended Disposition /'[]'$  Mobile Bus/ '[]'$  Follow-up with PCP ?Additional Notes: appt made tomorrow ?PCP not avail abel made appt with Tally Joe ? ? ? ? ?Reason for Disposition ? [1] Numbness or tingling in one or both feet AND [2] is a chronic symptom (recurrent or ongoing AND present > 4 weeks) ? ?Answer Assessment - Initial Assessment Questions ?1. SYMPTOM: "What is the main symptom you are concerned about?" (e.g., weakness, numbness) ?    Weakness and numbness ?2. ONSET: "When did this start?" (minutes, hours, days; while sleeping) ?    Ongoing 1 year ?3. LAST NORMAL: "When was the last time you (the patient) were normal (no symptoms)?" ?    2 years ago ?4. PATTERN "Does this come and go, or has it been constant since it started?"  "Is it present now?" ?    constant ?5. CARDIAC SYMPTOMS: "Have you had any of the following symptoms: chest pain, difficulty breathing, palpitations?" ?    no ?6. NEUROLOGIC SYMPTOMS: "Have you had any of the following symptoms: headache, dizziness, vision loss, double vision, changes in speech, unsteady on your feet?" ?    no ?7. OTHER SYMPTOMS: "Do you have any other symptoms?" ?    no ?8. PREGNANCY: "Is there any chance you are pregnant?" "When was your last menstrual period?" ?    *No Answer* ? ?Protocols used: Neurologic Deficit-A-AH ? ?

## 2021-07-28 ENCOUNTER — Encounter: Payer: Self-pay | Admitting: Family Medicine

## 2021-07-28 ENCOUNTER — Ambulatory Visit (INDEPENDENT_AMBULATORY_CARE_PROVIDER_SITE_OTHER): Payer: Medicare HMO | Admitting: Family Medicine

## 2021-07-28 VITALS — BP 132/50 | HR 103 | Temp 98.6°F | Ht 68.0 in | Wt 175.0 lb

## 2021-07-28 DIAGNOSIS — R202 Paresthesia of skin: Secondary | ICD-10-CM | POA: Diagnosis not present

## 2021-07-28 DIAGNOSIS — R200 Anesthesia of skin: Secondary | ICD-10-CM

## 2021-07-28 DIAGNOSIS — I10 Essential (primary) hypertension: Secondary | ICD-10-CM | POA: Diagnosis not present

## 2021-07-28 DIAGNOSIS — Z8739 Personal history of other diseases of the musculoskeletal system and connective tissue: Secondary | ICD-10-CM

## 2021-07-28 DIAGNOSIS — R7303 Prediabetes: Secondary | ICD-10-CM | POA: Diagnosis not present

## 2021-07-28 NOTE — Assessment & Plan Note (Signed)
Chronic, previous stable with diet/exercise ?Continue to recommend balanced, lower carb meals. Smaller meal size, adding snacks. Choosing water as drink of choice and increasing purposeful exercise. ?Repeat A1c ?

## 2021-07-28 NOTE — Progress Notes (Signed)
?  ?[employer] as a Education administrator for Gwyneth Sprout, FNP.,have documented all relevant documentation on the behalf of Gwyneth Sprout, FNP,as directed by  Gwyneth Sprout, FNP while in the presence of Gwyneth Sprout, FNP.  ? ?Established patient visit ? ?Patient: Mark Hunter   DOB: 13-Aug-1931   88 y.o. Male  MRN: [id number] ?Visit Date: 07/28/2021 ? ?Today's healthcare provider: Gwyneth Sprout, FNP  ?Introduced to Designer, jewellery role and practice setting.  All questions answered.  Discussed provider/patient relationship and expectations. ? ?Chief Complaint  ?Patient presents with  ? Numbness  ?  Patient presents in office today with complaints of numbness and tingling of his feet.Patient states that numbness and tingling began 10 days ago. Patient states that he has previously tried Vitamin B12 supplement to help with numbness and tingling as he was told in past to try before. Patient states that he had no improvement of symptoms on b12.  ? ?Subjective  ?  ?HPI ?HPI   ? ? Numbness   ? Additional comments: Patient presents in office today with complaints of numbness and tingling of his feet.Patient states that numbness and tingling began 10 days ago. Patient states that he has previously tried Vitamin B12 supplement to help with numbness and tingling as he was told in past to try before. Patient states that he had no improvement of symptoms on b12. ? ?  ?  ?Last edited by Minette Headland, CMA on 07/28/2021 10:34 AM.  ?  ?  ? ?Medications: ?Outpatient Medications Prior to Visit  ?Medication Sig  ? amLODipine (NORVASC) 10 MG tablet TAKE 1 TABLET (10 MG TOTAL) BY MOUTH DAILY.  ? aspirin 81 MG tablet Take 81 mg by mouth daily.  ? colchicine 0.6 MG tablet TAKE 2 TABLETS (1.2 MG) ON FIRST DAY OF GOUT FLARE. THEN, TAKE 1 TABLET (0.'6MG'$ ) DAILY UNTIL RESOLVED.  ? hydrochlorothiazide (HYDRODIURIL) 25 MG tablet TAKE 1 TABLET (25 MG TOTAL) BY MOUTH DAILY.  ? metoprolol succinate (TOPROL-XL) 50 MG 24 hr tablet TAKE 1  TABLET BY MOUTH EVERY DAY WITH OR IMMEDIATELY FOLLOWING A MEAL  ? Multiple Vitamin (MULTI-VITAMINS PO) Take 1 tablet by mouth once.  ? Omega-3 Fatty Acids (FISH OIL PO) Take 1 capsule by mouth once. 4000 mg  ? Propylene Glycol-Glycerin (SOOTHE) 0.6-0.6 % SOLN Place 3-4 drops into both eyes daily. Soothe  ? tamsulosin (FLOMAX) 0.4 MG CAPS capsule TAKE 1 CAPSULE BY MOUTH EVERY DAY  ? vitamin B-12 (CYANOCOBALAMIN) 1000 MCG tablet Take 1,000 mcg by mouth daily.  ? ?No facility-administered medications prior to visit.  ? ? ?Review of Systems ? ? ?  Objective  ?  ?BP (!) 132/50   Pulse (!) 103   Temp 98.6 ?F (37 ?C) (Oral)   Ht '5\' 8"'$  (1.727 m) Comment: patient reports  Wt 175 lb (79.4 kg)   BMI 26.61 kg/m?  ? ? ?Physical Exam ?Vitals and nursing note reviewed.  ?Constitutional:   ?   Appearance: Normal appearance. He is normal weight.  ?HENT:  ?   Head: Normocephalic and atraumatic.  ?Eyes:  ?   Pupils: Pupils are equal, round, and reactive to light.  ?Cardiovascular:  ?   Rate and Rhythm: Normal rate and regular rhythm. Occasional Extrasystoles are present. ?   Pulses: Decreased pulses.     ?     Radial pulses are 2+ on the right side and 2+ on the left side.  ?  Dorsalis pedis pulses are 1+ on the right side and 1+ on the left side.  ?   Heart sounds: Normal heart sounds.  ?   Comments: PACs present ?Pulmonary:  ?   Effort: Pulmonary effort is normal.  ?   Breath sounds: Normal breath sounds.  ?Musculoskeletal:     ?   General: No swelling. Normal range of motion.  ?   Cervical back: Normal range of motion.  ?   Right lower leg: No edema.  ?   Left lower leg: No edema.  ?Skin: ?   General: Skin is warm and dry.  ?   Capillary Refill: Capillary refill takes less than 2 seconds.  ?Neurological:  ?   General: No focal deficit present.  ?   Mental Status: He is alert and oriented to person, place, and time. Mental status is at baseline.  ?  ? ?No results found for any visits on 07/28/21. ? Assessment & Plan  ?   ? ?Problem List Items Addressed This Visit   ? ?  ? Cardiovascular and Mediastinum  ? Essential (primary) hypertension  ?  Chronic, stable ?Denies CP ?Denies SOB/ DOE ?Denies low blood pressure/hypotension ?Denies vision changes ?No LE Edema noted on exam ?Continue medication, Norvasc 10 mg, HCTZ 25 mg, Toprol XL 50 mg ?Denies side effects ?Seek emergent care if you develop chest pain or chest pressure ? ?  ?  ? Relevant Orders  ? Comprehensive Metabolic Panel (CMET)  ?  ? Other  ? Numbness and tingling of both lower extremities - Primary  ?  Acute on chronic, has been using additional B12 for total of 2000 mcg/day for past 10 days ?Check B12 and B6 as well as CBC today ?Referral to vascular for ABI and [place] if deemed appropriate  ? ?  ?  ? Relevant Orders  ? Ambulatory referral to Vascular Surgery  ? Uric acid  ? Vitamin B6  ? B12 and Folate Panel  ? CBC with Differential/Platelet  ? Comprehensive Metabolic Panel (CMET)  ? Magnesium  ? Personal history of gout  ?  Chronic, stable ?Recent use of colchicine for flare; denies daily medication for prevention ?Check uric acid levels today ?Recommend use of allopurinol if uric acid is >6 ? ?  ?  ? Relevant Orders  ? Uric acid  ? Pre-diabetes  ?  Chronic, previous stable with diet/exercise ?Continue to recommend balanced, lower carb meals. Smaller meal size, adding snacks. Choosing water as drink of choice and increasing purposeful exercise. ?Repeat A1c ? ?  ?  ? Relevant Orders  ? Hemoglobin A1c  ? ?Return if symptoms worsen or fail to improve.  ?   ?I, Gwyneth Sprout, FNP, have reviewed all documentation for this visit. The documentation on 07/28/21 for the exam, diagnosis, procedures, and orders are all accurate and complete. ? ?Gwyneth Sprout, FNP  ?[place] ?[phone] (phone) ?[phone] (fax) ? ?[facility] ?

## 2021-07-28 NOTE — Assessment & Plan Note (Signed)
Chronic, stable ?Recent use of colchicine for flare; denies daily medication for prevention ?Check uric acid levels today ?Recommend use of allopurinol if uric acid is >6 ?

## 2021-07-28 NOTE — Assessment & Plan Note (Addendum)
Acute on chronic, has been using additional B12 for total of 2000 mcg/day for past 10 days ?Check B12 and B6 as well as CBC today ?Referral to vascular for ABI and [place] if deemed appropriate  ?

## 2021-07-28 NOTE — Assessment & Plan Note (Signed)
Chronic, stable ?Denies CP ?Denies SOB/ DOE ?Denies low blood pressure/hypotension ?Denies vision changes ?No LE Edema noted on exam ?Continue medication, Norvasc 10 mg, HCTZ 25 mg, Toprol XL 50 mg ?Denies side effects ?Seek emergent care if you develop chest pain or chest pressure ? ?

## 2021-07-29 ENCOUNTER — Telehealth: Payer: Self-pay

## 2021-07-29 NOTE — Telephone Encounter (Signed)
Copied from [place]. Topic: Referral - Question ?>> Jul 29, 2021 11:24 AM Pawlus, Brayton Layman A wrote: ?Reason for CRM: Pt had some follow up questions regarding his referral to [facility], pt was connected to schedule in an appt and was advised they did not have the referral yet although it states it is ready to be scheduled, please advise. ?

## 2021-08-04 ENCOUNTER — Encounter: Payer: Self-pay | Admitting: Family Medicine

## 2021-08-04 ENCOUNTER — Other Ambulatory Visit: Payer: Self-pay | Admitting: Family Medicine

## 2021-08-04 DIAGNOSIS — R200 Anesthesia of skin: Secondary | ICD-10-CM

## 2021-08-07 LAB — COMPREHENSIVE METABOLIC PANEL
ALT: 11 IU/L (ref 0–44)
AST: 18 IU/L (ref 0–40)
Albumin/Globulin Ratio: 2 (ref 1.2–2.2)
Albumin: 4.7 g/dL — ABNORMAL HIGH (ref 3.6–4.6)
Alkaline Phosphatase: 49 IU/L (ref 44–121)
BUN/Creatinine Ratio: 11 (ref 10–24)
BUN: 11 mg/dL (ref 8–27)
Bilirubin Total: 0.6 mg/dL (ref 0.0–1.2)
CO2: 26 mmol/L (ref 20–29)
Calcium: 9.7 mg/dL (ref 8.6–10.2)
Chloride: 94 mmol/L — ABNORMAL LOW (ref 96–106)
Creatinine, Ser: 0.97 mg/dL (ref 0.76–1.27)
Globulin, Total: 2.3 g/dL (ref 1.5–4.5)
Glucose: 119 mg/dL — ABNORMAL HIGH (ref 70–99)
Potassium: 3.6 mmol/L (ref 3.5–5.2)
Sodium: 135 mmol/L (ref 134–144)
Total Protein: 7 g/dL (ref 6.0–8.5)
eGFR: 76 mL/min/{1.73_m2} (ref >59)

## 2021-08-07 LAB — HEMOGLOBIN A1C
Est. average glucose Bld gHb Est-mCnc: 126 mg/dL
Hgb A1c MFr Bld: 6 % — ABNORMAL HIGH (ref 4.8–5.6)

## 2021-08-07 LAB — CBC WITH DIFFERENTIAL/PLATELET
Basophils Absolute: 0.1 10*3/uL (ref 0.0–0.2)
Basos: 1 %
EOS (ABSOLUTE): 0 10*3/uL (ref 0.0–0.4)
Eos: 1 %
Hematocrit: 40.2 % (ref 37.5–51.0)
Hemoglobin: 14.8 g/dL (ref 13.0–17.7)
Immature Grans (Abs): 0 10*3/uL (ref 0.0–0.1)
Immature Granulocytes: 0 %
Lymphocytes Absolute: 1.2 10*3/uL (ref 0.7–3.1)
Lymphs: 24 %
MCH: 34.3 pg — ABNORMAL HIGH (ref 26.6–33.0)
MCHC: 36.8 g/dL — ABNORMAL HIGH (ref 31.5–35.7)
MCV: 93 fL (ref 79–97)
Monocytes Absolute: 0.6 10*3/uL (ref 0.1–0.9)
Monocytes: 13 %
Neutrophils Absolute: 2.9 10*3/uL (ref 1.4–7.0)
Neutrophils: 61 %
Platelets: 277 10*3/uL (ref 150–450)
RBC: 4.31 x10E6/uL (ref 4.14–5.80)
RDW: 12.2 % (ref 11.6–15.4)
WBC: 4.7 10*3/uL (ref 3.4–10.8)

## 2021-08-07 LAB — B12 AND FOLATE PANEL
Folate: >20 ng/mL (ref >3.0)
Vitamin B-12: 1327 pg/mL — ABNORMAL HIGH (ref 232–1245)

## 2021-08-07 LAB — VITAMIN B6: Vitamin B6: 34.8 ug/L (ref 3.4–65.2)

## 2021-08-07 LAB — URIC ACID: Uric Acid: 8 mg/dL (ref 3.8–8.4)

## 2021-08-07 LAB — MAGNESIUM: Magnesium: 2.2 mg/dL (ref 1.6–2.3)

## 2021-09-09 ENCOUNTER — Other Ambulatory Visit (INDEPENDENT_AMBULATORY_CARE_PROVIDER_SITE_OTHER): Payer: Self-pay | Admitting: Vascular Surgery

## 2021-09-09 DIAGNOSIS — R200 Anesthesia of skin: Secondary | ICD-10-CM

## 2021-09-11 ENCOUNTER — Encounter (INDEPENDENT_AMBULATORY_CARE_PROVIDER_SITE_OTHER): Payer: Self-pay

## 2021-09-11 ENCOUNTER — Encounter (INDEPENDENT_AMBULATORY_CARE_PROVIDER_SITE_OTHER): Payer: Self-pay | Admitting: Vascular Surgery

## 2021-09-15 ENCOUNTER — Encounter (INDEPENDENT_AMBULATORY_CARE_PROVIDER_SITE_OTHER): Payer: Self-pay | Admitting: Vascular Surgery

## 2021-09-15 ENCOUNTER — Ambulatory Visit (INDEPENDENT_AMBULATORY_CARE_PROVIDER_SITE_OTHER): Payer: Medicare HMO | Admitting: Vascular Surgery

## 2021-09-15 VITALS — BP 164/62 | HR 76 | Resp 17 | Ht 68.0 in | Wt 174.0 lb

## 2021-09-15 DIAGNOSIS — R202 Paresthesia of skin: Secondary | ICD-10-CM | POA: Diagnosis not present

## 2021-09-15 DIAGNOSIS — R200 Anesthesia of skin: Secondary | ICD-10-CM | POA: Diagnosis not present

## 2021-09-15 DIAGNOSIS — I10 Essential (primary) hypertension: Secondary | ICD-10-CM | POA: Diagnosis not present

## 2021-09-15 DIAGNOSIS — R7303 Prediabetes: Secondary | ICD-10-CM | POA: Diagnosis not present

## 2021-09-15 NOTE — Assessment & Plan Note (Signed)
blood glucose control important in reducing the progression of atherosclerotic disease. Also, involved in wound healing. On appropriate medications.  

## 2021-09-15 NOTE — Progress Notes (Signed)
Patient ID: Mark Hunter, male   DOB: 1932/03/22, 88 y.o.   MRN: [id number]  Chief Complaint  Patient presents with   Establish Care    Referred by Dr Rollene Rotunda    HPI Mark Hunter is a 88 y.o. male.  I am asked to see the patient by Dr. Maralyn Sago office for evaluation of numbness of both lower extremities.  This began after a bout of gout about 3 to 4 months ago.  Previously, he had had gout predominantly in the right leg but on this instance it was in the left foot.  Since that time, he has been bothered by numbness and tingling in both feet and lower legs.  This is made it difficult to drive.  It is impairing his balance and walking.  He denies any true pain.  He says he can walk up to a mile if he does so slowly.  No ulceration or infection.  No burning that requires him to dangle his feet or get up at night.     Past Medical History:  Diagnosis Date   BPH (benign prostatic hyperplasia)    Cancer ([place]) 2003   prostate   Cancer ([place]) 12/15/2015   SCC   Gout    History of measles    History of mumps    Hyperlipidemia    Hypertension     Past Surgical History:  Procedure Laterality Date   CATARACT EXTRACTION W/PHACO Right 12/16/2014   Procedure: CATARACT EXTRACTION PHACO AND INTRAOCULAR LENS PLACEMENT ([place]);  Surgeon: Estill Cotta, MD;  Location: [facility] ORS;  Service: Ophthalmology;  Laterality: Right;  [place]   1:12    CATARACT EXTRACTION W/PHACO Left 04/21/2016   Procedure: CATARACT EXTRACTION PHACO AND INTRAOCULAR LENS PLACEMENT ([facility]);  Surgeon: Estill Cotta, MD;  Location: [facility] ORS;  Service: Ophthalmology;  Laterality: Left;  [place] 01:27 AP% 21.8 CDE 34.78 Fluid pack lot # [id number]   COLONOSCOPY  04/11/2007   Dr. Donnella Sham, [facility]. Hyperplastic polyps; radiation colitis, multiple colonic angioectasias   LEG / ANKLE SOFT TISSUE BIOPSY     PROSTATE SURGERY  02/2002   seed implant   SHOULDER ARTHROSCOPY W/ ROTATOR CUFF REPAIR Right 05/03/2010   SQUAMOUS CELL CARCINOMA  EXCISION  12/14/2016   on right cheek     Family History  Problem Relation Age of Onset   Stroke Father    Cancer Brother        of eye, removed eyeball   Diabetes Neg Hx    Heart disease Neg Hx   No bleeding or clotting disorders   Social History   Tobacco Use   Smoking status: Never   Smokeless tobacco: Never  Vaping Use   Vaping Use: Never used  Substance Use Topics   Alcohol use: Yes    Alcohol/week: 7.0 standard drinks of alcohol    Types: 7 Cans of beer per week    Comment: 1 beer a night   Drug use: No     Allergies  Allergen Reactions   Indomethacin     INTERACTED WITH HIS METOPROLOL WHEN TAKEN IN 2021    Current Outpatient Medications  Medication Sig Dispense Refill   amLODipine (NORVASC) 10 MG tablet TAKE 1 TABLET (10 MG TOTAL) BY MOUTH DAILY. 90 tablet 4   aspirin 81 MG tablet Take 81 mg by mouth daily.     colchicine 0.6 MG tablet TAKE 2 TABLETS (1.2 MG) ON FIRST DAY OF GOUT FLARE. THEN, TAKE 1 TABLET (0.6MG) DAILY UNTIL  RESOLVED. 20 tablet 1   hydrochlorothiazide (HYDRODIURIL) 25 MG tablet TAKE 1 TABLET (25 MG TOTAL) BY MOUTH DAILY. 90 tablet 4   metoprolol succinate (TOPROL-XL) 50 MG 24 hr tablet TAKE 1 TABLET BY MOUTH EVERY DAY WITH OR IMMEDIATELY FOLLOWING A MEAL 90 tablet 1   Multiple Vitamin (MULTI-VITAMINS PO) Take 1 tablet by mouth once.     Omega-3 Fatty Acids (FISH OIL PO) Take 1 capsule by mouth once. 4000 mg     Propylene Glycol-Glycerin (SOOTHE) 0.6-0.6 % SOLN Place 3-4 drops into both eyes daily. Soothe     tamsulosin (FLOMAX) 0.4 MG CAPS capsule TAKE 1 CAPSULE BY MOUTH EVERY DAY 90 capsule 1   vitamin B-12 (CYANOCOBALAMIN) 1000 MCG tablet Take 1,000 mcg by mouth daily.     No current facility-administered medications for this visit.      REVIEW OF SYSTEMS (Negative unless checked)  Constitutional: '[]' Weight loss  '[]' Fever  '[]' Chills Cardiac: '[]' Chest pain   '[]' Chest pressure   '[]' Palpitations   '[]' Shortness of breath when laying flat    '[]' Shortness of breath at rest   '[]' Shortness of breath with exertion. Vascular:  '[]' Pain in legs with walking   '[]' Pain in legs at rest   '[]' Pain in legs when laying flat   '[]' Claudication   '[]' Pain in feet when walking  '[]' Pain in feet at rest  '[]' Pain in feet when laying flat   '[]' History of DVT   '[]' Phlebitis   '[]' Swelling in legs   '[]' Varicose veins   '[]' Non-healing ulcers Pulmonary:   '[]' Uses home oxygen   '[]' Productive cough   '[]' Hemoptysis   '[]' Wheeze  '[]' COPD   '[]' Asthma Neurologic:  '[]' Dizziness  '[]' Blackouts   '[]' Seizures   '[]' History of stroke   '[]' History of TIA  '[]' Aphasia   '[]' Temporary blindness   '[]' Dysphagia   '[]' Weakness or numbness in arms   '[x]' Weakness or numbness in legs Musculoskeletal:  '[x]' Arthritis   '[]' Joint swelling   '[]' Joint pain   '[]' Low back pain Hematologic:  '[]' Easy bruising  '[]' Easy bleeding   '[]' Hypercoagulable state   '[]' Anemic  '[]' Hepatitis Gastrointestinal:  '[]' Blood in stool   '[]' Vomiting blood  '[]' Gastroesophageal reflux/heartburn   '[]' Abdominal pain Genitourinary:  '[]' Chronic kidney disease   '[]' Difficult urination  '[]' Frequent urination  '[]' Burning with urination   '[]' Hematuria Skin:  '[]' Rashes   '[]' Ulcers   '[]' Wounds Psychological:  '[]' History of anxiety   '[]'  History of major depression.    Physical Exam BP (!) 164/62 (BP Location: Left Arm)   Pulse 76   Resp 17   Ht '5\' 8"'  (1.727 m)   Wt 174 lb (78.9 kg)   BMI 26.46 kg/m  Gen:  WD/WN, NAD.  Appears younger than stated age Head: [place]/AT, No temporalis wasting. Ear/Nose/Throat: Hearing grossly intact, nares w/o erythema or drainage, oropharynx w/o Erythema/Exudate Eyes: Conjunctiva clear, sclera non-icteric  Neck: trachea midline.  No JVD.  Pulmonary:  Good air movement, respirations not labored, no use of accessory muscles  Cardiac: RRR, no JVD Vascular:  Vessel Right Left  Radial Palpable Palpable                          DP Palpable Palpable  PT Palpable Palpable   Gastrointestinal:. No masses, surgical incisions, or  scars. Musculoskeletal: M/S 5/5 throughout.  Extremities without ischemic changes.  No deformity or atrophy.  Varicosities are present worse on the right than the left.  No significant lower extremity edema. Neurologic: Sensation grossly intact in extremities.  Symmetrical.  Speech  is fluent. Motor exam as listed above. Psychiatric: Judgment intact, Mood & affect appropriate for pt's clinical situation. Dermatologic: No rashes or ulcers noted.  No cellulitis or open wounds.    Radiology No results found.  Labs Recent Results (from the past 2160 hour(s))  Uric acid     Status: None   Collection Time: 07/28/21 10:52 AM  Result Value Ref Range   Uric Acid 8.0 3.8 - 8.4 mg/dL    Comment:            Therapeutic target for gout patients: <6.0  Vitamin B6     Status: None   Collection Time: 07/28/21 10:52 AM  Result Value Ref Range   Vitamin B6 34.8 3.4 - 65.2 ug/L    Comment:                              Deficiency:         <3.4                              Marginal:      3.4 - 5.1                              Adequate:           >5.1   B12 and Folate Panel     Status: Abnormal   Collection Time: 07/28/21 10:52 AM  Result Value Ref Range   Vitamin B-12 1,327 (H) 232 - 1,245 pg/mL   Folate >20.0 >3.0 ng/mL    Comment: A serum folate concentration of less than 3.1 ng/mL is considered to represent clinical deficiency.   CBC with Differential/Platelet     Status: Abnormal   Collection Time: 07/28/21 10:52 AM  Result Value Ref Range   WBC 4.7 3.4 - 10.8 x10E3/uL   RBC 4.31 4.14 - 5.80 x10E6/uL   Hemoglobin 14.8 13.0 - 17.7 g/dL   Hematocrit 40.2 37.5 - 51.0 %   MCV 93 79 - 97 fL   MCH 34.3 (H) 26.6 - 33.0 pg   MCHC 36.8 (H) 31.5 - 35.7 g/dL   RDW 12.2 11.6 - 15.4 %   Platelets 277 150 - 450 x10E3/uL   Neutrophils 61 Not Estab. %   Lymphs 24 Not Estab. %   Monocytes 13 Not Estab. %   Eos 1 Not Estab. %   Basos 1 Not Estab. %   Neutrophils Absolute 2.9 1.4 - 7.0 x10E3/uL    Lymphocytes Absolute 1.2 0.7 - 3.1 x10E3/uL   Monocytes Absolute 0.6 0.1 - 0.9 x10E3/uL   EOS (ABSOLUTE) 0.0 0.0 - 0.4 x10E3/uL   Basophils Absolute 0.1 0.0 - 0.2 x10E3/uL   Immature Granulocytes 0 Not Estab. %   Immature Grans (Abs) 0.0 0.0 - 0.1 x10E3/uL   Hematology Comments: Note:     Comment: Verified by microscopic examination.  Comprehensive Metabolic Panel (CMET)     Status: Abnormal   Collection Time: 07/28/21 10:52 AM  Result Value Ref Range   Glucose 119 (H) 70 - 99 mg/dL   BUN 11 8 - 27 mg/dL   Creatinine, Ser 0.97 0.76 - 1.27 mg/dL   eGFR 76 >59 mL/min/1.73   BUN/Creatinine Ratio 11 10 - 24   Sodium 135 134 - 144 mmol/L   Potassium 3.6 3.5 - 5.2 mmol/L   Chloride  94 (L) 96 - 106 mmol/L   CO2 26 20 - 29 mmol/L   Calcium 9.7 8.6 - 10.2 mg/dL   Total Protein 7.0 6.0 - 8.5 g/dL   Albumin 4.7 (H) 3.6 - 4.6 g/dL   Globulin, Total 2.3 1.5 - 4.5 g/dL   Albumin/Globulin Ratio 2.0 1.2 - 2.2   Bilirubin Total 0.6 0.0 - 1.2 mg/dL   Alkaline Phosphatase 49 44 - 121 IU/L   AST 18 0 - 40 IU/L   ALT 11 0 - 44 IU/L  Magnesium     Status: None   Collection Time: 07/28/21 10:52 AM  Result Value Ref Range   Magnesium 2.2 1.6 - 2.3 mg/dL  Hemoglobin A1c     Status: Abnormal   Collection Time: 07/28/21 10:52 AM  Result Value Ref Range   Hgb A1c MFr Bld 6.0 (H) 4.8 - 5.6 %    Comment:          Prediabetes: 5.7 - 6.4          Diabetes: >6.4          Glycemic control for adults with diabetes: <7.0    Est. average glucose Bld gHb Est-mCnc 126 mg/dL    Assessment/Plan:  Numbness and tingling of both lower extremities We had a discussion today regarding the pathophysiology and natural history of numbness in the lower extremities.  His symptoms sound like neuropathy, and one cause of neuropathy is poor arterial perfusion.  This is not the most frequent cause, but is a devastating cause and I think it is appropriate to assess his perfusion although I think it is less likely that that is  the culprit.  I recommend he wear compression socks on his upcoming trip and walk is much as possible.  We will see him back with ABIs in the near future at his convenience.  Essential (primary) hypertension blood pressure control important in reducing the progression of atherosclerotic disease. On appropriate oral medications.   Pre-diabetes blood glucose control important in reducing the progression of atherosclerotic disease. Also, involved in wound healing. On appropriate medications.      Leotis Pain 09/15/2021, 11:51 AM   This note was created with Dragon medical transcription system.  Any errors from dictation are unintentional.

## 2021-09-15 NOTE — Assessment & Plan Note (Signed)
We had a discussion today regarding the pathophysiology and natural history of numbness in the lower extremities.  His symptoms sound like neuropathy, and one cause of neuropathy is poor arterial perfusion.  This is not the most frequent cause, but is a devastating cause and I think it is appropriate to assess his perfusion although I think it is less likely that that is the culprit.  I recommend he wear compression socks on his upcoming trip and walk is much as possible.  We will see him back with ABIs in the near future at his convenience.

## 2021-09-15 NOTE — Patient Instructions (Signed)
Peripheral Vascular Disease  Peripheral vascular disease (PVD) is a disease of the blood vessels that carry blood from the heart to the rest of the body. PVD is also called peripheral artery disease (PAD) or poor circulation. PVD affects most of the body. But it affects the legs and feet the most. PVD can lead to acute limb ischemia. This happens when there is a sudden stop of blood flow to an arm or leg. This is a medical emergency. What are the causes? The most common cause of PVD is a buildup of a fatty substance (plaque) inside your arteries. This decreases blood flow. Plaque can break off and block blood in a smaller artery. This can lead to acute limb ischemia. Other common causes of PVD include: Blood clots inside the blood vessels. Injuries to blood vessels. Irritation and swelling of blood vessels. Sudden tightening of the blood vessel (spasms). What increases the risk? A family history of PVD. Medical conditions, including: High cholesterol. Diabetes. High blood pressure. Heart disease. Past problems with blood clots. Past injury, such as burns or a broken bone. Other conditions, such as: Buerger's disease. This is caused by swollen or irritated blood vessels in your hands and feet. Arthritis. Birth defects that affect the arteries in your legs. Kidney disease. Using tobacco or nicotine products. Not getting enough exercise. Being very overweight (obese). Being 50 years old or older. What are the signs or symptoms? Cramps in your butt, legs, and feet. Pain and weakness in your legs when you are active that goes away when you rest. Leg pain when at rest. Leg numbness, tingling, or weakness. Coldness in a leg or foot, especially when compared with the other leg or foot. Skin or hair changes. These can include: Hair loss. Shiny skin. Pale or bluish skin. Thick toenails. Being unable to get or keep an erection. Tiredness (fatigue). Weak pulse or no pulse in the  feet. Wounds and sores on the toes, feet, or legs. These take longer to heal. How is this treated? Underlying causes are treated first. Other conditions, like diabetes, high cholesterol, and blood pressure, are also treated. Treatment may include: Lifestyle changes, such as: Quitting smoking. Getting regular exercise. Having a diet low in fat and cholesterol. Not drinking alcohol. Taking medicines, such as: Blood thinners. Medicines to improve blood flow. Medicines to improve your blood cholesterol. Procedures to: Open the arteries and restore blood flow. Insert a small mesh tube (stent) to keep a blocked vessel open. Create a new path for blood to flow to the body (peripheral bypass). Remove dead tissue from a wound. Remove an affected leg or arm. Follow these instructions at home: Medicines Take over-the-counter and prescription medicines only as told by your doctor. If you are taking blood thinners: Talk with your doctor before you take any medicines that have aspirin, or NSAIDs, such as ibuprofen. Take medicines exactly as told. Take them at the same time each day. Avoid doing things that could hurt or bruise you. Take action to prevent falls. Wear an alert bracelet or carry a card that shows you are taking blood thinners. Lifestyle     Get regular exercise. Ask your doctor about how to stay active. Talk with your doctor about keeping a healthy weight. If needed, ask about losing weight. Eat a diet that is low in fat and cholesterol. If you need help, talk with your doctor. Do not drink alcohol. Do not smoke or use any products that contain nicotine or tobacco. If you need help   quitting, ask your doctor. General instructions Take good care of your feet. To do this: Wear shoes that fit well and feel good. Check your feet often for any cuts or sores. Get a flu shot (influenza vaccine) each year. Keep all follow-up visits. Where to find more information Society for  Vascular Surgery: vascular.org American Heart Association: heart.org National Heart, Lung, and Blood Institute: nhlbi.nih.gov Contact a doctor if: You have cramps in your legs when you walk. You have leg pain when you rest. Your leg or foot feels cold. Your skin changes. You cannot get or keep an erection. You have cuts or sores on your legs or feet that do not heal. Get help right away if: You have sudden changes in the color and feeling of your arms or legs, such as: Your arm or leg turns cold, numb, and blue. Your arm or leg becomes red, warm, swollen, painful, or numb. You have any signs of a stroke. "BE FAST" is an easy way to remember the main warning signs: B - Balance. Dizziness, sudden trouble walking, or loss of balance. E - Eyes. Trouble seeing or a change in how you see. F - Face. Sudden weakness or loss of feeling of the face. The face or eyelid may droop on one side. A - Arms. Weakness or loss of feeling in an arm. This happens all of a sudden and most often on one side of the body. S - Speech. Sudden trouble speaking, slurred speech, or trouble understanding what people say. T - Time. Time to call emergency services. Write down what time symptoms started. You have other signs of a stroke, such as: A sudden, very bad headache with no known cause. Feeling like you may vomit (nausea). Vomiting. A seizure. You have chest pain or trouble breathing. These symptoms may be an emergency. Get help right away. Call your local emergency services (911 in the [place] Do not wait to see if the symptoms will go away. Do not drive yourself to the hospital. Summary Peripheral vascular disease (PVD) is a disease of the blood vessels. PVD affects the legs and feet the most. Symptoms may include leg pain or leg numbness, tingling, and weakness. Treatment may include lifestyle changes, medicines, and procedures. This information is not intended to replace advice given to you by your health  care provider. Make sure you discuss any questions you have with your health care provider. Document Revised: 09/17/2019 Document Reviewed: 09/17/2019 Elsevier Patient Education  2023 Elsevier Inc.  

## 2021-09-15 NOTE — Assessment & Plan Note (Signed)
blood pressure control important in reducing the progression of atherosclerotic disease. On appropriate oral medications.  

## 2021-09-16 ENCOUNTER — Ambulatory Visit (INDEPENDENT_AMBULATORY_CARE_PROVIDER_SITE_OTHER): Payer: Medicare HMO

## 2021-09-16 DIAGNOSIS — R200 Anesthesia of skin: Secondary | ICD-10-CM

## 2021-09-16 DIAGNOSIS — R202 Paresthesia of skin: Secondary | ICD-10-CM | POA: Diagnosis not present

## 2021-10-01 ENCOUNTER — Other Ambulatory Visit: Payer: Self-pay | Admitting: Family Medicine

## 2021-10-01 ENCOUNTER — Telehealth: Payer: Self-pay

## 2021-10-01 DIAGNOSIS — R200 Anesthesia of skin: Secondary | ICD-10-CM

## 2021-10-01 NOTE — Telephone Encounter (Signed)
Copied from [place] [id number]. Topic: General - Other >> Oct 01, 2021 11:37 AM Shiquita J wrote: Reason for CRM: pt called in to follow up on results from [facility]. Pt says that he was referred by Tally Joe.   CB: [phone]- please assist pt further.

## 2021-10-16 ENCOUNTER — Other Ambulatory Visit: Payer: Self-pay | Admitting: Family Medicine

## 2021-10-26 DIAGNOSIS — M25561 Pain in right knee: Secondary | ICD-10-CM | POA: Diagnosis not present

## 2021-10-26 DIAGNOSIS — M25562 Pain in left knee: Secondary | ICD-10-CM | POA: Diagnosis not present

## 2021-10-26 DIAGNOSIS — G629 Polyneuropathy, unspecified: Secondary | ICD-10-CM | POA: Diagnosis not present

## 2021-10-26 DIAGNOSIS — R200 Anesthesia of skin: Secondary | ICD-10-CM | POA: Diagnosis not present

## 2021-10-26 DIAGNOSIS — Z7689 Persons encountering health services in other specified circumstances: Secondary | ICD-10-CM | POA: Diagnosis not present

## 2021-10-26 DIAGNOSIS — R202 Paresthesia of skin: Secondary | ICD-10-CM | POA: Diagnosis not present

## 2021-10-27 ENCOUNTER — Ambulatory Visit: Payer: Medicare HMO | Admitting: Family Medicine

## 2021-11-09 ENCOUNTER — Encounter: Payer: Self-pay | Admitting: Family Medicine

## 2021-11-09 ENCOUNTER — Ambulatory Visit (INDEPENDENT_AMBULATORY_CARE_PROVIDER_SITE_OTHER): Payer: Medicare HMO | Admitting: Family Medicine

## 2021-11-09 VITALS — BP 128/64 | HR 64 | Temp 98.4°F | Resp 16 | Wt 173.4 lb

## 2021-11-09 DIAGNOSIS — R200 Anesthesia of skin: Secondary | ICD-10-CM

## 2021-11-09 DIAGNOSIS — R202 Paresthesia of skin: Secondary | ICD-10-CM | POA: Diagnosis not present

## 2021-11-09 DIAGNOSIS — I10 Essential (primary) hypertension: Secondary | ICD-10-CM | POA: Diagnosis not present

## 2021-11-09 DIAGNOSIS — Z8739 Personal history of other diseases of the musculoskeletal system and connective tissue: Secondary | ICD-10-CM | POA: Diagnosis not present

## 2021-11-09 NOTE — Progress Notes (Unsigned)
I,Mark Hunter,acting as a scribe for Mark Huh, MD.,have documented all relevant documentation on the behalf of Mark Huh, MD,as directed by  Mark Huh, MD while in the presence of Mark Huh, MD.    Established patient visit   Patient: Mark Hunter   DOB: June 18, 1931   88 y.o. Male  MRN: [id number] Visit Date: 11/09/2021  Today's healthcare provider: Lelon Huh, MD   Chief Complaint  Patient presents with   Hypertension   Subjective    HPI HPI   Pre-diabetes Gout  Last edited by Alanson Puls, CMA on 11/09/2021 10:24 AM.      Hypertension, follow-up  BP Readings from Last 3 Encounters:  11/09/21 128/64  09/15/21 (!) 164/62  07/28/21 (!) 132/50   Wt Readings from Last 3 Encounters:  11/09/21 173 lb 6.4 oz (78.7 kg)  09/15/21 174 lb (78.9 kg)  07/28/21 175 lb (79.4 kg)     He was last seen for hypertension 3 months ago (seen by Tally Joe, FNP).  BP at that visit was 132/50. Management since that visit includes: Continue medication, Norvasc 10 mg, HCTZ 25 mg, Toprol XL 50 mg. He reports excellent compliance with treatment. He is not having side effects. He is following a Low Sodium diet. He is exercising. He does not smoke.  Use of agents associated with hypertension: none.   Outside blood pressures GTX:[id number] 130's-60's Symptoms: No chest pain No chest pressure  No palpitations No syncope  No dyspnea No orthopnea  No paroxysmal nocturnal dyspnea No lower extremity edema   Pertinent labs Lab Results  Component Value Date   CHOL 188 05/13/2021   HDL 41 05/13/2021   LDLCALC 124 (H) 05/13/2021   TRIG 127 05/13/2021   CHOLHDL 4.6 05/13/2021   Lab Results  Component Value Date   NA 135 07/28/2021   K 3.6 07/28/2021   CREATININE 0.97 07/28/2021   EGFR 76 07/28/2021   GLUCOSE 119 (H) 07/28/2021   TSH 1.040 10/25/2019     The ASCVD Risk score (Arnett DK, et al., 2019) failed to calculate for the following reasons:    The 2019 ASCVD risk score is only valid for ages 98 to 72  ---------------------------------------------------------------------------------------------------   Prediabetes, Follow-up  Lab Results  Component Value Date   HGBA1C 6.0 (H) 07/28/2021   HGBA1C 6.1 (H) 05/13/2021   HGBA1C 6.1 (A) 11/04/2020   GLUCOSE 119 (H) 07/28/2021   GLUCOSE 113 (H) 05/13/2021   GLUCOSE 101 (H) 04/10/2020    Last seen for for this 3 months ago (seen by Tally Joe, FNP).  Management since that visit includes:  Continue to recommend balanced, lower carb meals. Smaller meal size, adding snacks. Choosing water as drink of choice and increasing purposeful exercise   Current symptoms include none and have been stable.  Prior visit with dietician: no Current diet: in general, a "healthy" diet   Current exercise: walking  Pertinent Labs:    Component Value Date/Time   CHOL 188 05/13/2021 0840   TRIG 127 05/13/2021 0840   CHOLHDL 4.6 05/13/2021 0840   CREATININE 0.97 07/28/2021 1052    Wt Readings from Last 3 Encounters:  11/09/21 173 lb 6.4 oz (78.7 kg)  09/15/21 174 lb (78.9 kg)  07/28/21 175 lb (79.4 kg)    -----------------------------------------------------------------------------------------. Follow up for gout:  The patient was last seen for this 3 months ago. During that visit uric acid level was checked. Recommend use of allopurinol if uric acid is >6.  He  reports fair compliance with treatment. He feels that condition is Unchanged. He is not having side effects.   -----------------------------------------------------------------------------------------   Medications: Outpatient Medications Prior to Visit  Medication Sig   amLODipine (NORVASC) 10 MG tablet TAKE 1 TABLET (10 MG TOTAL) BY MOUTH DAILY.   aspirin 81 MG tablet Take 81 mg by mouth daily.   colchicine 0.6 MG tablet TAKE 2 TABLETS (1.2 MG) ON FIRST DAY OF GOUT FLARE. THEN, TAKE 1 TABLET (0.6MG) DAILY UNTIL RESOLVED.    gabapentin (NEURONTIN) 100 MG capsule Take by mouth.   hydrochlorothiazide (HYDRODIURIL) 25 MG tablet TAKE 1 TABLET (25 MG TOTAL) BY MOUTH DAILY.   metoprolol succinate (TOPROL-XL) 50 MG 24 hr tablet TAKE 1 TABLET BY MOUTH EVERY DAY WITH OR IMMEDIATELY FOLLOWING A MEAL   Multiple Vitamin (MULTI-VITAMINS PO) Take 1 tablet by mouth once.   Omega-3 Fatty Acids (FISH OIL PO) Take 1 capsule by mouth once. 4000 mg   Propylene Glycol-Glycerin (SOOTHE) 0.6-0.6 % SOLN Place 3-4 drops into both eyes daily. Soothe   tamsulosin (FLOMAX) 0.4 MG CAPS capsule TAKE 1 CAPSULE BY MOUTH EVERY DAY   vitamin B-12 (CYANOCOBALAMIN) 1000 MCG tablet Take 1,000 mcg by mouth daily.   No facility-administered medications prior to visit.    Review of Systems  {Labs  Heme  Chem  Endocrine  Serology  Results Review (optional):23779}   Objective    BP 128/64 (BP Location: Left Arm, Patient Position: Sitting, Cuff Size: Normal)   Pulse 64   Temp 98.4 F (36.9 C) (Oral)   Resp 16   Wt 173 lb 6.4 oz (78.7 kg)   SpO2 97%   BMI 26.37 kg/m  {Show previous vital signs (optional):23777}  Physical Exam  ***  No results found for any visits on 11/09/21.  Assessment & Plan     ***  No follow-ups on file.      {provider attestation***:1}   Mark Huh, MD  [facility] [phone] (phone) [phone] (fax)  [place]

## 2021-11-16 ENCOUNTER — Ambulatory Visit: Payer: Medicare HMO | Admitting: Family Medicine

## 2021-12-24 DIAGNOSIS — R200 Anesthesia of skin: Secondary | ICD-10-CM | POA: Diagnosis not present

## 2021-12-24 DIAGNOSIS — G629 Polyneuropathy, unspecified: Secondary | ICD-10-CM | POA: Diagnosis not present

## 2021-12-24 DIAGNOSIS — R202 Paresthesia of skin: Secondary | ICD-10-CM | POA: Diagnosis not present

## 2021-12-24 DIAGNOSIS — M25562 Pain in left knee: Secondary | ICD-10-CM | POA: Diagnosis not present

## 2021-12-24 DIAGNOSIS — M25561 Pain in right knee: Secondary | ICD-10-CM | POA: Diagnosis not present

## 2021-12-31 ENCOUNTER — Other Ambulatory Visit: Payer: Self-pay | Admitting: Family Medicine

## 2021-12-31 DIAGNOSIS — I10 Essential (primary) hypertension: Secondary | ICD-10-CM

## 2022-01-25 ENCOUNTER — Encounter (INDEPENDENT_AMBULATORY_CARE_PROVIDER_SITE_OTHER): Payer: Self-pay

## 2022-02-18 ENCOUNTER — Other Ambulatory Visit: Payer: Self-pay | Admitting: Family Medicine

## 2022-03-24 ENCOUNTER — Other Ambulatory Visit: Payer: Self-pay | Admitting: Family Medicine

## 2022-03-26 ENCOUNTER — Emergency Department: Payer: Medicare HMO

## 2022-03-26 ENCOUNTER — Other Ambulatory Visit: Payer: Self-pay

## 2022-03-26 DIAGNOSIS — I10 Essential (primary) hypertension: Secondary | ICD-10-CM | POA: Diagnosis not present

## 2022-03-26 DIAGNOSIS — R0989 Other specified symptoms and signs involving the circulatory and respiratory systems: Secondary | ICD-10-CM | POA: Diagnosis not present

## 2022-03-26 DIAGNOSIS — T17208A Unspecified foreign body in pharynx causing other injury, initial encounter: Secondary | ICD-10-CM | POA: Diagnosis not present

## 2022-03-26 DIAGNOSIS — M47812 Spondylosis without myelopathy or radiculopathy, cervical region: Secondary | ICD-10-CM | POA: Diagnosis not present

## 2022-03-26 DIAGNOSIS — X58XXXA Exposure to other specified factors, initial encounter: Secondary | ICD-10-CM | POA: Insufficient documentation

## 2022-03-26 NOTE — ED Triage Notes (Signed)
Pt reports was eating a burrito ~1830 when he swallowed something hard that he feels is lodged in throat. Pt speaking in full sentences and maintaining secretions. Breathing unlabored with symmetric chest rise and fall. Denies hx of food bolus or lodged foreign body. Pt alert and oriented following commands.

## 2022-03-26 NOTE — ED Provider Triage Note (Signed)
Emergency Medicine Provider Triage Evaluation Note  DEARION HUOT , a 88 y.o. male  was evaluated in triage.  Pt complains of swallowed fb.  Review of Systems  Positive:  Negative:   Physical Exam  BP (!) 171/82 (BP Location: Left Arm)   Pulse 83   Temp 98.2 F (36.8 C) (Oral)   Resp 18   Ht '5\' 8"'$  (1.727 m)   Wt 68 kg   SpO2 99%   BMI 22.81 kg/m  Gen:   Awake, no distress   Resp:  Normal effort  MSK:   Moves extremities without difficulty  Other:    Medical Decision Making  Medically screening exam initiated at 9:12 PM.  Appropriate orders placed.  Rosita Fire was informed that the remainder of the evaluation will be completed by another provider, this initial triage assessment does not replace that evaluation, and the importance of remaining in the ED until their evaluation is complete.     Versie Starks, PA-C 03/26/22 2115

## 2022-03-26 NOTE — ED Notes (Signed)
Patient provided with coke to drink while waiting fo xray. Pt reports he thinks he can feel object moving down esophagus as he drinks

## 2022-03-26 NOTE — ED Notes (Signed)
No stridor noted on auscultation

## 2022-03-27 ENCOUNTER — Emergency Department
Admission: EM | Admit: 2022-03-27 | Discharge: 2022-03-27 | Disposition: A | Discharge status: Home / Self Care | Payer: Medicare HMO | Attending: Emergency Medicine | Admitting: Emergency Medicine

## 2022-03-27 DIAGNOSIS — T17208A Unspecified foreign body in pharynx causing other injury, initial encounter: Secondary | ICD-10-CM

## 2022-03-27 MED ORDER — ALUM & MAG HYDROXIDE-SIMETH 200-200-20 MG/5ML PO SUSP
30.0000 mL | Freq: Once | ORAL | Status: AC
  Administered 2022-03-27: 30 mL via ORAL
  Filled 2022-03-27: qty 30

## 2022-03-27 MED ORDER — LIDOCAINE VISCOUS HCL 2 % MT SOLN
15.0000 mL | Freq: Once | OROMUCOSAL | Status: AC
  Administered 2022-03-27: 15 mL via ORAL
  Filled 2022-03-27: qty 15

## 2022-03-27 NOTE — ED Provider Notes (Signed)
[facility] Provider Note    Event Date/Time   First MD Initiated Contact with Patient 03/27/22 0150     (approximate)   History   Chief Complaint Swallowed Foreign Body   HPI  KIETH HARTIS is a 88 y.o. male with past medical history of hypertension and hyperlipidemia who presents to the ED complaining of swallowed foreign body.  Patient reports that while he was eating dinner he felt like something got stuck in the back of his throat.  He reports it feels like it is still stuck in the upper part of his throat, but he denies any difficulty breathing and has not had any vomiting.  He has been able to take small sips of water since then without any difficulty.  He denies any history of similar symptoms.     Physical Exam   Triage Vital Signs: ED Triage Vitals [03/26/22 2046]  Enc Vitals Group     BP (!) 171/82     Pulse Rate (!) 56     Resp 18     Temp 98.2 F (36.8 C)     Temp Source Oral     SpO2 99 %     Weight 150 lb (68 kg)     Height '5\' 8"'$  (1.727 m)     Head Circumference      Peak Flow      Pain Score 0     Pain Loc      Pain Edu?      Excl. in [place]?     Most recent vital signs: Vitals:   03/26/22 2048 03/27/22 0013  BP:  (!) 156/76  Pulse: 83 67  Resp:  16  Temp:  97.7 F (36.5 C)  SpO2:  95%    Constitutional: Alert and oriented. Eyes: Conjunctivae are normal. Head: Atraumatic. Nose: No congestion/rhinnorhea. Mouth/Throat: Mucous membranes are moist.  Posterior oropharynx clear with no obvious foreign body. Cardiovascular: Normal rate, regular rhythm. Grossly normal heart sounds.  2+ radial pulses bilaterally. Respiratory: Normal respiratory effort.  No retractions. Lungs CTAB. Gastrointestinal: Soft and nontender. No distention. Musculoskeletal: No lower extremity tenderness nor edema.  Neurologic:  Normal speech and language. No gross focal neurologic deficits are appreciated.    ED Results / Procedures /  Treatments   Labs (all labs ordered are listed, but only abnormal results are displayed) Labs Reviewed - No data to display  RADIOLOGY Neck x-ray reviewed and interpreted by me with no foreign body or soft tissue edema noted.  PROCEDURES:  Critical Care performed: No  Procedures   MEDICATIONS ORDERED IN ED: Medications  alum & mag hydroxide-simeth (MAALOX/MYLANTA) 200-200-20 MG/5ML suspension 30 mL (30 mLs Oral Given 03/27/22 0216)    And  lidocaine (XYLOCAINE) 2 % viscous mouth solution 15 mL (15 mLs Oral Given 03/27/22 0216)     IMPRESSION / MDM / ASSESSMENT AND PLAN / ED COURSE  I reviewed the triage vital signs and the nursing notes.                              88 y.o. male with past medical history of hypertension and hyperlipidemia who presents to the ED complaining of foreign body sensation in his throat since eating dinner.  Patient's presentation is most consistent with acute complicated illness / injury requiring diagnostic workup.  Differential diagnosis includes, but is not limited to, impacted food bolus, aspiration, esophageal irritation.  Patient well-appearing  and in no acute distress, vital signs are unremarkable.  He is tolerating his oral secretions without difficulty and is not in any respiratory distress.  Patient has been able to swallow liquids without difficulty but continues to state it feels like there is something stuck in the back of his throat.  He was given GI cocktail and shortly afterwards coughed up part of a bay leaf.  He states his symptoms have not resolved and he is tolerating oral intake without difficulty.  He is appropriate for discharge home with PCP follow-up as needed, was counseled to return to the ED for new or worsening symptoms.  Patient agrees with plan.      FINAL CLINICAL IMPRESSION(S) / ED DIAGNOSES   Final diagnoses:  Foreign body in pharynx, initial encounter     Rx / DC Orders   ED Discharge Orders     None         Note:  This document was prepared using Dragon voice recognition software and may include unintentional dictation errors.   Blake Divine, MD 03/27/22 [phone]

## 2022-04-28 DIAGNOSIS — R200 Anesthesia of skin: Secondary | ICD-10-CM | POA: Diagnosis not present

## 2022-04-28 DIAGNOSIS — M25562 Pain in left knee: Secondary | ICD-10-CM | POA: Diagnosis not present

## 2022-04-28 DIAGNOSIS — R202 Paresthesia of skin: Secondary | ICD-10-CM | POA: Diagnosis not present

## 2022-04-28 DIAGNOSIS — G629 Polyneuropathy, unspecified: Secondary | ICD-10-CM | POA: Diagnosis not present

## 2022-04-28 DIAGNOSIS — M25561 Pain in right knee: Secondary | ICD-10-CM | POA: Diagnosis not present

## 2022-05-14 NOTE — Progress Notes (Unsigned)
Complete Physical Exam     Patient: Mark Hunter, Male    DOB: 09/03/1931, 89 y.o.   MRN: [id number] Visit Date: 05/17/2022  Today's Provider: Lelon Huh, MD   No chief complaint on file.  Subjective    Mark Hunter is a 89 y.o. male who presents today for his complete physical examination. He reports consuming a general diet.  He generally feels well. He reports sleeping fairly well. He does have additional problems to discuss today. He has been having numbness and tingling in his feet L>R. Has seend     Medications: Outpatient Medications Prior to Visit  Medication Sig   amLODipine (NORVASC) 10 MG tablet TAKE 1 TABLET BY MOUTH EVERY DAY   aspirin 81 MG tablet Take 81 mg by mouth daily.   colchicine 0.6 MG tablet TAKE 2 TABLETS (1.2 MG) ON FIRST DAY OF GOUT FLARE. THEN, TAKE 1 TABLET (0.6MG) DAILY UNTIL RESOLVED.   gabapentin (NEURONTIN) 100 MG capsule Take by mouth.  twice a day for 1 week, then increase to 200 mg twice a day.   hydrochlorothiazide (HYDRODIURIL) 25 MG tablet TAKE 1 TABLET (25 MG TOTAL) BY MOUTH DAILY.   metoprolol succinate (TOPROL-XL) 50 MG 24 hr tablet TAKE 1 TABLET BY MOUTH EVERY DAY WITH OR IMMEDIATELY FOLLOWING A MEAL   Multiple Vitamin (MULTI-VITAMINS PO) Take 1 tablet by mouth once.   Omega-3 Fatty Acids (FISH OIL PO) Take 1 capsule by mouth once. 4000 mg   Propylene Glycol-Glycerin (SOOTHE) 0.6-0.6 % SOLN Place 3-4 drops into both eyes daily. Soothe   tamsulosin (FLOMAX) 0.4 MG CAPS capsule TAKE 1 CAPSULE BY MOUTH EVERY DAY   vitamin B-12 (CYANOCOBALAMIN) 1000 MCG tablet Take 1,000 mcg by mouth daily.   No facility-administered medications prior to visit.    Allergies  Allergen Reactions   Indomethacin     INTERACTED WITH HIS METOPROLOL WHEN TAKEN IN 2021    Patient Care Team: Birdie Sons, MD as PCP - General (Family Medicine) Dingeldein, Remo Lipps, MD as Consulting Physician (Ophthalmology) Ree Edman, MD as  Consulting Physician (Dermatology) [place], Adalberto Ill (Neurology)  Review of Systems  Constitutional: Negative.   HENT: Negative.    Eyes: Negative.   Respiratory: Negative.    Cardiovascular: Negative.   Gastrointestinal: Negative.   Endocrine: Negative.   Genitourinary: Negative.   Musculoskeletal: Negative.   Skin: Negative.   Allergic/Immunologic: Negative.   Neurological: Negative.   Hematological: Negative.   Psychiatric/Behavioral: Negative.         Objective    Vitals: BP (!) 137/56 (BP Location: Right Arm, Patient Position: Sitting, Cuff Size: Normal)   Pulse 86   Temp 98.5 F (36.9 C)   Ht 5' 8"$  (1.727 m)   Wt 178 lb (80.7 kg)   SpO2 98%   BMI 27.06 kg/m    Physical Exam  General Appearance:    Well developed, well nourished male. Alert, cooperative, in no acute distress, appears stated age  Head:    Normocephalic, without obvious abnormality, atraumatic  Eyes:    PERRL, conjunctiva/corneas clear, EOM's intact, fundi    benign, both eyes       Ears:    Normal TM's and external ear canals, both ears  Nose:   Nares normal, septum midline, mucosa normal, no drainage   or sinus tenderness  Throat:   Lips, mucosa, and tongue normal; teeth and gums normal  Neck:   Supple, symmetrical, trachea midline, no adenopathy;  thyroid:  No enlargement/tenderness/nodules; no carotid   bruit or JVD  Back:     Symmetric, no curvature, ROM normal, no CVA tenderness  Lungs:     Clear to auscultation bilaterally, respirations unlabored  Chest wall:    No tenderness or deformity  Heart:    Normal heart rate. Normal rhythm. No murmurs, rubs, or gallops.  S1 and S2 normal  Abdomen:     Soft, non-tender, bowel sounds active all four quadrants,    no masses, no organomegaly  Genitalia:    deferred  Rectal:    deferred  Extremities:   All extremities are intact. No cyanosis or edema  Pulses:   2+ and symmetric all extremities  Skin:   Skin color, texture, turgor  normal, no rashes or lesions  Lymph nodes:   Cervical, supraclavicular, and axillary nodes normal  Neurologic:   CNII-XII intact. Normal strength, sensation and reflexes      throughout   EKG: Sinus rhythm with premature atrial complexes. Otherwise normal ECG   Assessment & Plan     Completed physical exam  2. Prostate cancer screening  - PSA Total (Reflex To Free) ([facility] only)  3. Pre-diabetes  - Hemoglobin A1c  4. Gouty arthropathy Does with occasional colchicine which he only has to take once or twice a year.   5. Personal history of gout  - Uric acid  6. Primary hypertension Well controlled.  Continue current medications.   - CBC - Comprehensive metabolic panel - Lipid panel  7. Numbness and tingling of both lower extremities Is currently taking gabapentin prescribed by neurology.  - Vitamin B12     The entirety of the information documented in the History of Present Illness, Review of Systems and Physical Exam were personally obtained by me. Portions of this information were initially documented by the CMA and reviewed by me for thoroughness and accuracy.     Lelon Huh, MD  [facility] [phone] (phone) [phone] (fax)  [place]

## 2022-05-17 ENCOUNTER — Encounter: Payer: Self-pay | Admitting: Family Medicine

## 2022-05-17 ENCOUNTER — Ambulatory Visit (INDEPENDENT_AMBULATORY_CARE_PROVIDER_SITE_OTHER): Payer: Medicare HMO | Admitting: Family Medicine

## 2022-05-17 VITALS — BP 137/56 | HR 86 | Temp 98.5°F | Ht 68.0 in | Wt 178.0 lb

## 2022-05-17 DIAGNOSIS — I10 Essential (primary) hypertension: Secondary | ICD-10-CM | POA: Diagnosis not present

## 2022-05-17 DIAGNOSIS — R200 Anesthesia of skin: Secondary | ICD-10-CM | POA: Diagnosis not present

## 2022-05-17 DIAGNOSIS — Z8739 Personal history of other diseases of the musculoskeletal system and connective tissue: Secondary | ICD-10-CM | POA: Diagnosis not present

## 2022-05-17 DIAGNOSIS — Z125 Encounter for screening for malignant neoplasm of prostate: Secondary | ICD-10-CM

## 2022-05-17 DIAGNOSIS — R7303 Prediabetes: Secondary | ICD-10-CM | POA: Diagnosis not present

## 2022-05-17 DIAGNOSIS — Z0000 Encounter for general adult medical examination without abnormal findings: Secondary | ICD-10-CM | POA: Diagnosis not present

## 2022-05-17 DIAGNOSIS — R202 Paresthesia of skin: Secondary | ICD-10-CM | POA: Diagnosis not present

## 2022-05-17 DIAGNOSIS — M109 Gout, unspecified: Secondary | ICD-10-CM

## 2022-05-17 NOTE — Patient Instructions (Signed)
.   Please review the attached list of medications and notify my office if there are any errors.   . Please bring all of your medications to every appointment so we can make sure that our medication list is the same as yours.   

## 2022-05-17 NOTE — Progress Notes (Signed)
Annual Wellness Visit     Patient: Mark Hunter, Male    DOB: 1932-01-02, 89 y.o.   MRN: [id number] Visit Date: 05/17/2022  Today's Provider: Lelon Huh, MD    Subjective    Patient presents today for his annual wellness visit.   Medications: Outpatient Medications Prior to Visit  Medication Sig   amLODipine (NORVASC) 10 MG tablet TAKE 1 TABLET BY MOUTH EVERY DAY   aspirin 81 MG tablet Take 81 mg by mouth daily.   gabapentin (NEURONTIN) 100 MG capsule Take by mouth.  twice a day for 1 week, then increase to 200 mg twice a day.   hydrochlorothiazide (HYDRODIURIL) 25 MG tablet TAKE 1 TABLET (25 MG TOTAL) BY MOUTH DAILY.   metoprolol succinate (TOPROL-XL) 50 MG 24 hr tablet TAKE 1 TABLET BY MOUTH EVERY DAY WITH OR IMMEDIATELY FOLLOWING A MEAL   Multiple Vitamin (MULTI-VITAMINS PO) Take 1 tablet by mouth once.   Omega-3 Fatty Acids (FISH OIL PO) Take 1 capsule by mouth once. 4000 mg   Propylene Glycol-Glycerin (SOOTHE) 0.6-0.6 % SOLN Place 3-4 drops into both eyes daily. Soothe   tamsulosin (FLOMAX) 0.4 MG CAPS capsule TAKE 1 CAPSULE BY MOUTH EVERY DAY   vitamin B-12 (CYANOCOBALAMIN) 1000 MCG tablet Take 1,000 mcg by mouth daily.   colchicine 0.6 MG tablet TAKE 2 TABLETS (1.2 MG) ON FIRST DAY OF GOUT FLARE. THEN, TAKE 1 TABLET (0.6MG) DAILY UNTIL RESOLVED. (Patient not taking: Reported on 05/17/2022)   No facility-administered medications prior to visit.    Allergies  Allergen Reactions   Indomethacin     INTERACTED WITH HIS METOPROLOL WHEN TAKEN IN 2021    Patient Care Team: Birdie Sons, MD as PCP - General (Family Medicine) Dingeldein, Remo Lipps, MD as Consulting Physician (Ophthalmology) Ree Edman, MD as Consulting Physician (Dermatology)  Review of Systems  Constitutional:  Negative for appetite change, chills and fever.  Respiratory:  Negative for chest tightness, shortness of breath and wheezing.   Cardiovascular:  Negative for chest pain and  palpitations.  Gastrointestinal:  Negative for abdominal pain, nausea and vomiting.     Objective      Most recent functional status assessment:    05/17/2022    9:05 AM  In your present state of health, do you have any difficulty performing the following activities:  Hearing? 0  Vision? 0  Difficulty concentrating or making decisions? 0  Walking or climbing stairs? 0  Dressing or bathing? 0  Doing errands, shopping? 0  Preparing Food and eating ? N  Using the Toilet? N  In the past six months, have you accidently leaked urine? N  Do you have problems with loss of bowel control? N  Managing your Medications? N  Managing your Finances? N  Housekeeping or managing your Housekeeping? N   Most recent fall risk assessment:    05/17/2022    9:05 AM  Fall Risk   Falls in the past year? 0  Number falls in past yr: 0  Injury with Fall? 0    Most recent depression screenings:    05/17/2022    9:05 AM 11/09/2021   10:37 AM  PHQ 2/9 Scores  PHQ - 2 Score 0 0  PHQ- 9 Score  0   Most recent cognitive screening:    05/17/2022    9:07 AM  6CIT Screen  What Year? 0 points  What month? 0 points  What time? 0 points  Count back from 20 0 points  Months in reverse 0 points  Repeat phrase 4 points  Total Score 4 points   Most recent Audit-C alcohol use screening    11/09/2021   10:37 AM  Alcohol Use Disorder Test (AUDIT)  1. How often do you have a drink containing alcohol? 2  2. How many drinks containing alcohol do you have on a typical day when you are drinking? 0  3. How often do you have six or more drinks on one occasion? 0  AUDIT-C Score 2   A score of 3 or more in women, and 4 or more in men indicates increased risk for alcohol abuse, EXCEPT if all of the points are from question 1     Assessment & Plan     Annual wellness visit done today including the all of the following: Reviewed patient's Family Medical History Reviewed and updated list of patient's  medical providers Assessment of cognitive impairment was done Assessed patient's functional ability Established a written schedule for health screening [place] Completed and Reviewed  Exercise Activities and Dietary recommendations  Goals      DIET - INCREASE WATER INTAKE     Recommend increasing water intake to 4 glasses a day.       Exercise      Recommend to start walking 3 days a week for at least 30 minutes at a time.         Immunization History  Administered Date(s) Administered   COVID-19, mRNA, vaccine(Comirnaty)12 years and older 12/29/2021   Fluad Quad(high Dose 65+) 12/26/2018   H1N1 02/08/2008   Influenza Split 12/22/2009   Influenza, High Dose Seasonal PF 01/17/2017, 12/22/2017, 12/12/2019, 12/18/2020, 12/29/2021   Influenza-Unspecified 12/24/2017   PFIZER Comirnaty(Gray Top)Covid-19 Tri-Sucrose Vaccine 07/24/2020   PFIZER(Purple Top)SARS-COV-2 Vaccination 04/05/2019, 04/26/2019, 01/10/2020   Pfizer Covid-19 Vaccine Bivalent Booster 55yr & up 01/07/2021   Pneumococcal Conjugate-13 04/23/2014   Pneumococcal Polysaccharide-23 01/22/2004   Respiratory Syncytial Virus Vaccine,Recomb Aduvanted(Arexvy) 12/14/2021   Tdap 07/17/2015   Zoster Recombinat (Shingrix) 06/16/2020, 11/20/2020   Zoster, Live 04/13/2006    Health Maintenance  Topic Date Due   Medicare Annual Wellness (AWV)  05/18/2023   DTaP/Tdap/Td (2 - Td or Tdap) 07/16/2025   Pneumonia Vaccine 89 Years old  Completed   INFLUENZA VACCINE  Completed   COVID-19 Vaccine  Completed   Zoster Vaccines- Shingrix  Completed   HPV VACCINES  Aged Out     Discussed health benefits of physical activity, and encouraged him to engage in regular exercise appropriate for his age and condition.       The entirety of the information documented in the History of Present Illness, Review of Systems and Physical Exam were personally obtained by me. Portions of this information were initially  documented by the CMA and reviewed by me for thoroughness and accuracy.     DLelon Huh MD  [phone](phone) [phone](fax)  [place]

## 2022-05-18 LAB — COMPREHENSIVE METABOLIC PANEL
ALT: 10 IU/L (ref 0–44)
AST: 17 IU/L (ref 0–40)
Albumin/Globulin Ratio: 1.8 (ref 1.2–2.2)
Albumin: 4.5 g/dL (ref 3.7–4.7)
Alkaline Phosphatase: 48 IU/L (ref 44–121)
BUN/Creatinine Ratio: 12 (ref 10–24)
BUN: 13 mg/dL (ref 8–27)
Bilirubin Total: 0.5 mg/dL (ref 0.0–1.2)
CO2: 25 mmol/L (ref 20–29)
Calcium: 9.4 mg/dL (ref 8.6–10.2)
Chloride: 96 mmol/L (ref 96–106)
Creatinine, Ser: 1.09 mg/dL (ref 0.76–1.27)
Globulin, Total: 2.5 g/dL (ref 1.5–4.5)
Glucose: 101 mg/dL — ABNORMAL HIGH (ref 70–99)
Potassium: 3.5 mmol/L (ref 3.5–5.2)
Sodium: 139 mmol/L (ref 134–144)
Total Protein: 7 g/dL (ref 6.0–8.5)
eGFR: 65 mL/min/{1.73_m2} (ref >59)

## 2022-05-18 LAB — HEMOGLOBIN A1C
Est. average glucose Bld gHb Est-mCnc: 128 mg/dL
Hgb A1c MFr Bld: 6.1 % — ABNORMAL HIGH (ref 4.8–5.6)

## 2022-05-18 LAB — CBC
Hematocrit: 40.7 % (ref 37.5–51.0)
Hemoglobin: 14.6 g/dL (ref 13.0–17.7)
MCH: 33.9 pg — ABNORMAL HIGH (ref 26.6–33.0)
MCHC: 35.9 g/dL — ABNORMAL HIGH (ref 31.5–35.7)
MCV: 94 fL (ref 79–97)
Platelets: 263 10*3/uL (ref 150–450)
RBC: 4.31 x10E6/uL (ref 4.14–5.80)
RDW: 12.8 % (ref 11.6–15.4)
WBC: 5.7 10*3/uL (ref 3.4–10.8)

## 2022-05-18 LAB — LIPID PANEL
Chol/HDL Ratio: 4.7 ratio (ref 0.0–5.0)
Cholesterol, Total: 177 mg/dL (ref 100–199)
HDL: 38 mg/dL — ABNORMAL LOW (ref >39)
LDL Chol Calc (NIH): 102 mg/dL — ABNORMAL HIGH (ref 0–99)
Triglycerides: 213 mg/dL — ABNORMAL HIGH (ref 0–149)
VLDL Cholesterol Cal: 37 mg/dL (ref 5–40)

## 2022-05-18 LAB — VITAMIN B12: Vitamin B-12: 1150 pg/mL (ref 232–1245)

## 2022-05-18 LAB — URIC ACID: Uric Acid: 8.4 mg/dL (ref 3.8–8.4)

## 2022-05-18 LAB — PSA TOTAL (REFLEX TO FREE): Prostate Specific Ag, Serum: <0.1 ng/mL (ref 0.0–4.0)

## 2022-06-26 ENCOUNTER — Other Ambulatory Visit: Payer: Self-pay | Admitting: Family Medicine

## 2022-06-26 DIAGNOSIS — I10 Essential (primary) hypertension: Secondary | ICD-10-CM

## 2022-08-20 ENCOUNTER — Other Ambulatory Visit: Payer: Self-pay | Admitting: Family Medicine

## 2022-08-20 DIAGNOSIS — M109 Gout, unspecified: Secondary | ICD-10-CM

## 2022-10-11 ENCOUNTER — Other Ambulatory Visit: Payer: Self-pay | Admitting: Family Medicine

## 2022-10-27 DIAGNOSIS — G629 Polyneuropathy, unspecified: Secondary | ICD-10-CM | POA: Diagnosis not present

## 2022-10-27 DIAGNOSIS — R200 Anesthesia of skin: Secondary | ICD-10-CM | POA: Diagnosis not present

## 2022-10-27 DIAGNOSIS — R202 Paresthesia of skin: Secondary | ICD-10-CM | POA: Diagnosis not present

## 2023-03-16 ENCOUNTER — Other Ambulatory Visit: Payer: Self-pay | Admitting: Family Medicine

## 2023-03-16 DIAGNOSIS — I10 Essential (primary) hypertension: Secondary | ICD-10-CM

## 2023-03-16 NOTE — Telephone Encounter (Signed)
Pt given enough med to last until upcoming appt. > 3 months overdue.  Requested Prescriptions  Pending Prescriptions Disp Refills   metoprolol succinate (TOPROL-XL) 50 MG 24 hr tablet [Pharmacy Med Name: METOPROLOL SUCC ER 50 MG TAB] 50 tablet 0    Sig: TAKE 1 TABLET BY MOUTH EVERY DAY WITH OR IMMEDIATELY FOLLOWING A MEAL     Cardiovascular:  Beta Blockers Failed - 03/16/2023 10:44 AM      Failed - Valid encounter within last 6 months    Recent Outpatient Visits           10 months ago Annual physical exam   [place] [facility] Malva Limes, MD   1 year ago Primary hypertension   [place] [facility] Malva Limes, MD   1 year ago Numbness and tingling of both lower extremities   [place] [facility] Jacky Kindle, FNP   1 year ago Annual physical exam   [facility] Malva Limes, MD   2 years ago Pre-diabetes   [place] [facility] Malva Limes, MD              Passed - Last BP in normal range    BP Readings from Last 1 Encounters:  05/17/22 (!) 137/56         Passed - Last Heart Rate in normal range    Pulse Readings from Last 1 Encounters:  05/17/22 86

## 2023-04-13 ENCOUNTER — Other Ambulatory Visit: Payer: Self-pay | Admitting: Family Medicine

## 2023-04-30 ENCOUNTER — Other Ambulatory Visit: Payer: Self-pay | Admitting: Family Medicine

## 2023-04-30 DIAGNOSIS — I10 Essential (primary) hypertension: Secondary | ICD-10-CM

## 2023-05-02 NOTE — Telephone Encounter (Signed)
Pt. Has appointment. Requested Prescriptions  Pending Prescriptions Disp Refills   metoprolol succinate (TOPROL-XL) 50 MG 24 hr tablet [Pharmacy Med Name: METOPROLOL SUCC ER 50 MG TAB] 50 tablet 0    Sig: TAKE 1 TABLET BY MOUTH EVERY DAY WITH OR IMMEDIATELY FOLLOWING A MEAL     Cardiovascular:  Beta Blockers Failed - 05/02/2023  2:07 PM      Failed - Valid encounter within last 6 months    Recent Outpatient Visits           11 months ago Annual physical exam   [facility] Malva Limes, MD   1 year ago Primary hypertension   [place] [facility] Malva Limes, MD   1 year ago Numbness and tingling of both lower extremities    [facility] Jacky Kindle, FNP   1 year ago Annual physical exam   [facility] Malva Limes, MD   2 years ago Pre-diabetes   [facility] Malva Limes, MD              Passed - Last BP in normal range    BP Readings from Last 1 Encounters:  05/17/22 (!) 137/56         Passed - Last Heart Rate in normal range    Pulse Readings from Last 1 Encounters:  05/17/22 86

## 2023-05-11 ENCOUNTER — Other Ambulatory Visit: Payer: Self-pay | Admitting: Family Medicine

## 2023-05-19 ENCOUNTER — Encounter: Payer: Medicare HMO | Admitting: Family Medicine

## 2023-05-20 ENCOUNTER — Encounter: Payer: Medicare HMO | Admitting: Family Medicine

## 2023-05-25 ENCOUNTER — Encounter: Payer: Self-pay | Admitting: Family Medicine

## 2023-05-25 ENCOUNTER — Ambulatory Visit (INDEPENDENT_AMBULATORY_CARE_PROVIDER_SITE_OTHER): Payer: Medicare HMO | Admitting: Family Medicine

## 2023-05-25 VITALS — BP 142/69 | HR 70 | Resp 16 | Ht 68.0 in | Wt 182.7 lb

## 2023-05-25 DIAGNOSIS — Z0000 Encounter for general adult medical examination without abnormal findings: Secondary | ICD-10-CM

## 2023-05-25 DIAGNOSIS — R7303 Prediabetes: Secondary | ICD-10-CM | POA: Diagnosis not present

## 2023-05-25 DIAGNOSIS — R202 Paresthesia of skin: Secondary | ICD-10-CM | POA: Diagnosis not present

## 2023-05-25 DIAGNOSIS — R200 Anesthesia of skin: Secondary | ICD-10-CM | POA: Diagnosis not present

## 2023-05-25 DIAGNOSIS — I10 Essential (primary) hypertension: Secondary | ICD-10-CM | POA: Diagnosis not present

## 2023-05-25 MED ORDER — GABAPENTIN 100 MG PO CAPS: 100.0000 mg | ORAL_CAPSULE | Freq: Two times a day (BID) | ORAL | 2 refills | Status: DC

## 2023-05-25 NOTE — Progress Notes (Signed)
 Complete physical exam   Patient: Mark Hunter   DOB: 03/08/1932   89 y.o. Male  MRN: [id number] Visit Date: 05/25/2023  Today's healthcare provider: Mila Merry, MD   Chief Complaint  Patient presents with   Annual Exam   Prediabetes   Hypertension   Subjective    Discussed the use of AI scribe software for clinical note transcription with the patient, who gave verbal consent to proceed.  History of Present Illness   The patient presents for a yearly checkup and physical exam.  He is experiencing numbness in his feet and is currently taking gabapentin 300 mg twice a day. He has attempted to reduce the dose to 100 mg twice a day but experienced significant discomfort after three to four days, leading him to resume the higher dose. He reports no pain associated with the numbness.  He monitors his blood pressure at home, which typically ranges from 130/80 to 110/70. He does not check it regularly but notes that it can be influenced by coffee or sugar intake.  He has a history of prediabetes but does not regularly monitor his blood sugar levels. He is aware of his prediabetic status but does not feel the need to check his sugar frequently.  He has a history of gout, which is currently under control. He occasionally uses colchicine for flare-ups, taking it for two to three days as needed, and keeps a supply of 25 to 30 pills on hand. He reports that it effectively alleviates symptoms within a day.  He is currently taking tamsulosin (Flomax) and vitamin B12, with no reported issues.       Past Medical History:  Diagnosis Date   BPH (benign prostatic hyperplasia)    Cancer (HCC) 2003   prostate   Cancer (HCC) 12/15/2015   [facility]   Gout    History of measles    History of mumps    Hyperlipidemia    Hypertension    Past Surgical History:  Procedure Laterality Date   CATARACT EXTRACTION W/PHACO Right 12/16/2014   Procedure: CATARACT EXTRACTION PHACO AND INTRAOCULAR  LENS PLACEMENT (IOC);  Surgeon: Sallee Lange, MD;  Location: [facility] ORS;  Service: Ophthalmology;  Laterality: Right;  [place]   1:12    CATARACT EXTRACTION W/PHACO Left 04/21/2016   Procedure: CATARACT EXTRACTION PHACO AND INTRAOCULAR LENS PLACEMENT (IOC);  Surgeon: Sallee Lange, MD;  Location: [facility] ORS;  Service: Ophthalmology;  Laterality: Left;  [place] 01:27 AP% 21.8 CDE 34.78 Fluid pack lot # [id number]   COLONOSCOPY  04/11/2007   Dr. Ricki Rodriguez, [facility]. Hyperplastic polyps; radiation colitis, multiple colonic angioectasias   LEG / ANKLE SOFT TISSUE BIOPSY     PROSTATE SURGERY  02/2002   seed implant   SHOULDER ARTHROSCOPY W/ ROTATOR CUFF REPAIR Right 05/03/2010   SQUAMOUS CELL CARCINOMA EXCISION  12/14/2016   on right cheek   Social History   Socioeconomic History   Marital status: Married    Spouse name: Not on file   Number of children: 3   Years of education: Not on file   Highest education level: Some college, no degree  Occupational History   Occupation: Retired  Tobacco Use   Smoking status: Never   Smokeless tobacco: Never  Vaping Use   Vaping status: Never Used  Substance and Sexual Activity   Alcohol use: Not Currently    Alcohol/week: 7.0 standard drinks of alcohol    Types: 7 Cans of beer per week    Comment: 1  beer a night   Drug use: No   Sexual activity: Not on file  Other Topics Concern   Not on file  Social History Narrative   Not on file   Social Drivers of Health   Financial Resource Strain: Low Risk  (05/25/2023)   Overall Financial Resource Strain (CARDIA)    Difficulty of Paying Living Expenses: Not hard at all  Food Insecurity: No Food Insecurity (05/25/2023)   Hunger Vital Sign    Worried About Running Out of Food in the Last Year: Never true    Ran Out of Food in the Last Year: Never true  Transportation Needs: No Transportation Needs (05/25/2023)   PRAPARE - Administrator, Civil Service (Medical): No    Lack of Transportation  (Non-Medical): No  Physical Activity: Sufficiently Active (05/25/2023)   Exercise Vital Sign    Days of Exercise per Week: 3 days    Minutes of Exercise per Session: 50 min  Stress: No Stress Concern Present (05/25/2023)   [employer] of Occupational Health - Occupational Stress Questionnaire    Feeling of Stress : Not at all  Social Connections: Moderately Isolated (05/25/2023)   Social Connection and Isolation Panel [NHANES]    Frequency of Communication with Friends and Family: Three times a week    Frequency of Social Gatherings with Friends and Family: Once a week    Attends Religious Services: Never    Database administrator or Organizations: No    Attends Banker Meetings: Never    Marital Status: Married  Catering manager Violence: Not At Risk (05/25/2023)   Humiliation, Afraid, Rape, and Kick questionnaire    Fear of Current or Ex-Partner: No    Emotionally Abused: No    Physically Abused: No    Sexually Abused: No   Family Status  Relation Name Status   Mother  Deceased       died at 42, no health issues   Father  Deceased at age 28       Cause of death: Advanced age   Brother  Deceased at age 2       died from massive heart attack   Brother  Alive   Neg Hx  (Not Specified)  No partnership data on file   Family History  Problem Relation Age of Onset   Stroke Father    Cancer Brother        of eye, removed eyeball   Diabetes Neg Hx    Heart disease Neg Hx    Allergies  Allergen Reactions   Indomethacin     INTERACTED WITH HIS METOPROLOL WHEN TAKEN IN 2021    Patient Care Team: Malva Limes, MD as PCP - General (Family Medicine) Dingeldein, Viviann Spare, MD as Consulting Physician (Ophthalmology) Jesusita Oka, MD as Consulting Physician (Dermatology) Frazer, Renne Musca, PA-C (Neurology)   Medications: Outpatient Medications Prior to Visit  Medication Sig   amLODipine (NORVASC) 10 MG tablet TAKE 1 TABLET BY MOUTH EVERY DAY    aspirin 81 MG tablet Take 81 mg by mouth daily.   colchicine 0.6 MG tablet TAKE 2 TABLETS ON FIRST DAY OF GOUT FLARE. THEN, TAKE 1 TABLET (0.6MG ) DAILY UNTIL RESOLVED.   gabapentin (NEURONTIN) 300 MG capsule Take 300 mg by mouth 2 (two) times daily.   hydrochlorothiazide (HYDRODIURIL) 25 MG tablet TAKE 1 TABLET (25 MG TOTAL) BY MOUTH DAILY.   metoprolol succinate (TOPROL-XL) 50 MG 24 hr tablet TAKE 1 TABLET BY MOUTH  EVERY DAY WITH OR IMMEDIATELY FOLLOWING A MEAL   Multiple Vitamin (MULTI-VITAMINS PO) Take 1 tablet by mouth once.   Omega-3 Fatty Acids (FISH OIL PO) Take 1 capsule by mouth once. 4000 mg   Propylene Glycol-Glycerin (SOOTHE) 0.6-0.6 % SOLN Place 3-4 drops into both eyes daily. Soothe   tamsulosin (FLOMAX) 0.4 MG CAPS capsule TAKE 1 CAPSULE BY MOUTH EVERY DAY   vitamin B-12 (CYANOCOBALAMIN) 1000 MCG tablet Take 1,000 mcg by mouth daily.   gabapentin (NEURONTIN) 100 MG capsule Take by mouth.  twice a day for 1 week, then increase to 200 mg twice a day. (Patient not taking: Reported on 05/25/2023)   No facility-administered medications prior to visit.    Review of Systems  Constitutional:  Negative for appetite change, chills and fever.  Respiratory:  Negative for chest tightness, shortness of breath and wheezing.   Cardiovascular:  Negative for chest pain and palpitations.  Gastrointestinal:  Negative for abdominal pain, nausea and vomiting.      Objective    BP (!) 142/69 (BP Location: Left Arm, Patient Position: Sitting, Cuff Size: Normal)   Pulse 70   Resp 16   Ht 5\' 8"  (1.727 m)   Wt 182 lb 11.2 oz (82.9 kg)   BMI 27.78 kg/m    Physical Exam   General Appearance:    Well developed, well nourished male. Alert, cooperative, in no acute distress, appears stated age  Head:    Normocephalic, without obvious abnormality, atraumatic  Eyes:    PERRL, conjunctiva/corneas clear, EOM's intact, fundi    benign, both eyes       Ears:    Normal TM's and external ear canals,  both ears  Nose:   Nares normal, septum midline, mucosa normal, no drainage   or sinus tenderness  Throat:   Lips, mucosa, and tongue normal; teeth and gums normal  Neck:   Supple, symmetrical, trachea midline, no adenopathy;       thyroid:  No enlargement/tenderness/nodules; no carotid   bruit or JVD  Back:     Symmetric, no curvature, ROM normal, no CVA tenderness  Lungs:     Clear to auscultation bilaterally, respirations unlabored  Chest wall:    No tenderness or deformity  Heart:    Normal heart rate. Normal rhythm. No murmurs, rubs, or gallops.  S1 and S2 normal  Abdomen:     Soft, non-tender, bowel sounds active all four quadrants,    no masses, no organomegaly  Genitalia:    deferred  Rectal:    deferred  Extremities:   All extremities are intact. No cyanosis or edema  Pulses:   2+ and symmetric all extremities  Skin:   Scattered typical seborrheic keratoses and cherry hemangiomas scattered across back and chest. No suspicious lesions.   Lymph nodes:   Cervical, supraclavicular, and axillary nodes normal  Neurologic:   CNII-XII intact. Normal strength, sensation and reflexes      throughout      Assessment & Plan    Routine Health Maintenance and Physical Exam  Exercise Activities and Dietary recommendations  Goals      DIET - INCREASE WATER INTAKE     Recommend increasing water intake to 4 glasses a day.       Exercise      Recommend to start walking 3 days a week for at least 30 minutes at a time.         Immunization History  Administered Date(s) Administered   Fluad Quad(high  Dose 65+) 12/26/2018   H1N1 02/08/2008   Influenza Split 12/22/2009   Influenza, High Dose Seasonal PF 01/17/2017, 12/22/2017, 12/12/2019, 12/18/2020, 12/29/2021, 12/20/2022   Influenza-Unspecified 12/24/2017   PFIZER Comirnaty(Gray Top)Covid-19 Tri-Sucrose Vaccine 07/24/2020   PFIZER(Purple Top)SARS-COV-2 Vaccination 04/05/2019, 04/26/2019, 01/10/2020   Pfizer Covid-19 Vaccine  Bivalent Booster 19yrs & up 01/07/2021   Pfizer(Comirnaty)Fall Seasonal Vaccine 12 years and older 12/29/2021, 12/20/2022   Pneumococcal Conjugate-13 04/23/2014   Pneumococcal Polysaccharide-23 01/22/2004   Respiratory Syncytial Virus Vaccine,Recomb Aduvanted(Arexvy) 12/14/2021   Tdap 07/17/2015   Zoster Recombinant(Shingrix) 06/16/2020, 11/20/2020   Zoster, Live 04/13/2006    Health Maintenance  Topic Date Due   Medicare Annual Wellness (AWV)  05/18/2023   DTaP/Tdap/Td (2 - Td or Tdap) 07/16/2025   Pneumonia Vaccine 59+ Years old  Completed   INFLUENZA VACCINE  Completed   COVID-19 Vaccine  Completed   Zoster Vaccines- Shingrix  Completed   HPV VACCINES  Aged Out    Discussed health benefits of physical activity, and encouraged him to engage in regular exercise appropriate for his age and condition.    2. Primary hypertension BP near goal. Continue current medications.   - CBC - Lipid panel - Comprehensive metabolic panel  3. Pre-diabetes  - Hemoglobin A1c  4. Numbness and tingling of both lower extremities  - gabapentin (NEURONTIN) 100 MG capsule; Take 1 capsule (100 mg total) by mouth 2 (two) times daily.  twice a day for 1 week, then increase to 200 mg twice a day.  Dispense: 60 capsule; Refill: 2 - Vitamin B12      Mila Merry, MD  [facility] [phone] (phone) [phone] (fax)  [facility]

## 2023-05-25 NOTE — Progress Notes (Signed)
 Annual Wellness Visit     Patient: Mark Hunter, Male    DOB: 1931/06/02, 89 y.o.   MRN: [id number] Visit Date: 05/25/2023  Today's Provider: Mila Merry, MD   Chief Complaint  Patient presents with   Medicare Wellness   Annual Exam   Prediabetes   Hypertension   Subjective    WISTER HOEFLE is a 89 y.o. male who presents today for his Annual Wellness Visit.   Medications: Outpatient Medications Prior to Visit  Medication Sig   amLODipine (NORVASC) 10 MG tablet TAKE 1 TABLET BY MOUTH EVERY DAY   aspirin 81 MG tablet Take 81 mg by mouth daily.   colchicine 0.6 MG tablet TAKE 2 TABLETS ON FIRST DAY OF GOUT FLARE. THEN, TAKE 1 TABLET (0.6MG ) DAILY UNTIL RESOLVED.   gabapentin (NEURONTIN) 300 MG capsule Take 300 mg by mouth 2 (two) times daily.   hydrochlorothiazide (HYDRODIURIL) 25 MG tablet TAKE 1 TABLET (25 MG TOTAL) BY MOUTH DAILY.   metoprolol succinate (TOPROL-XL) 50 MG 24 hr tablet TAKE 1 TABLET BY MOUTH EVERY DAY WITH OR IMMEDIATELY FOLLOWING A MEAL   Multiple Vitamin (MULTI-VITAMINS PO) Take 1 tablet by mouth once.   Omega-3 Fatty Acids (FISH OIL PO) Take 1 capsule by mouth once. 4000 mg   Propylene Glycol-Glycerin (SOOTHE) 0.6-0.6 % SOLN Place 3-4 drops into both eyes daily. Soothe   tamsulosin (FLOMAX) 0.4 MG CAPS capsule TAKE 1 CAPSULE BY MOUTH EVERY DAY   vitamin B-12 (CYANOCOBALAMIN) 1000 MCG tablet Take 1,000 mcg by mouth daily.   gabapentin (NEURONTIN) 100 MG capsule Take by mouth.  twice a day for 1 week, then increase to 200 mg twice a day. (Patient not taking: Reported on 05/25/2023)   No facility-administered medications prior to visit.    Allergies  Allergen Reactions   Indomethacin     INTERACTED WITH HIS METOPROLOL WHEN TAKEN IN 2021    Patient Care Team: Malva Limes, MD as PCP - General (Family Medicine) Dingeldein, Viviann Spare, MD as Consulting Physician (Ophthalmology) Jesusita Oka, MD as Consulting Physician  (Dermatology) Petra Kuba (Neurology)    Objective     Most recent functional status assessment:    05/25/2023    9:55 AM  In your present state of health, do you have any difficulty performing the following activities:  Hearing? 0  Vision? 0  Difficulty concentrating or making decisions? 0  Walking or climbing stairs? 0  Dressing or bathing? 0  Doing errands, shopping? 0   Most recent fall risk assessment:    05/25/2023    9:48 AM  Fall Risk   Falls in the past year? 0  Number falls in past yr: 0  Injury with Fall? 0  Risk for fall due to : No Fall Risks    Most recent depression screenings:    05/25/2023    9:48 AM 05/17/2022    9:05 AM  PHQ 2/9 Scores  PHQ - 2 Score 0 0   Most recent cognitive screening:    05/25/2023    9:48 AM  6CIT Screen  What Year? 0 points  What month? 0 points  What time? 0 points  Count back from 20 0 points  Months in reverse 0 points  Repeat phrase 2 points  Total Score 2 points   Most recent Audit-C alcohol use screening    05/25/2023    9:59 AM  Alcohol Use Disorder Test (AUDIT)  1. How often do you have a  drink containing alcohol? 0  2. How many drinks containing alcohol do you have on a typical day when you are drinking? 0  3. How often do you have six or more drinks on one occasion? 0  AUDIT-C Score 0   A score of 3 or more in women, and 4 or more in men indicates increased risk for alcohol abuse, EXCEPT if all of the points are from question 1   No results found for any visits on 05/25/23.  Assessment & Plan     Annual wellness visit done today including the all of the following: Reviewed patient's Family Medical History Reviewed and updated list of patient's medical providers Assessment of cognitive impairment was done Assessed patient's functional ability Established a written schedule for health screening services Health Risk Assessent Completed and Reviewed  Exercise Activities and Dietary  recommendations  Goals      DIET - INCREASE WATER INTAKE     Recommend increasing water intake to 4 glasses a day.       Exercise      Recommend to start walking 3 days a week for at least 30 minutes at a time.         Immunization History  Administered Date(s) Administered   Fluad Quad(high Dose 65+) 12/26/2018   H1N1 02/08/2008   Influenza Split 12/22/2009   Influenza, High Dose Seasonal PF 01/17/2017, 12/22/2017, 12/12/2019, 12/18/2020, 12/29/2021, 12/20/2022   Influenza-Unspecified 12/24/2017   PFIZER Comirnaty(Gray Top)Covid-19 Tri-Sucrose Vaccine 07/24/2020   PFIZER(Purple Top)SARS-COV-2 Vaccination 04/05/2019, 04/26/2019, 01/10/2020   Pfizer Covid-19 Vaccine Bivalent Booster 50yrs & up 01/07/2021   Pfizer(Comirnaty)Fall Seasonal Vaccine 12 years and older 12/29/2021, 12/20/2022   Pneumococcal Conjugate-13 04/23/2014   Pneumococcal Polysaccharide-23 01/22/2004   Respiratory Syncytial Virus Vaccine,Recomb Aduvanted(Arexvy) 12/14/2021   Tdap 07/17/2015   Zoster Recombinant(Shingrix) 06/16/2020, 11/20/2020   Zoster, Live 04/13/2006    Health Maintenance  Topic Date Due   Medicare Annual Wellness (AWV)  05/18/2023   DTaP/Tdap/Td (2 - Td or Tdap) 07/16/2025   Pneumonia Vaccine 72+ Years old  Completed   INFLUENZA VACCINE  Completed   COVID-19 Vaccine  Completed   Zoster Vaccines- Shingrix  Completed   HPV VACCINES  Aged Out     Discussed health benefits of physical activity, and encouraged him to engage in regular exercise appropriate for his age and condition.          Mila Merry, MD  [facility] [phone] (phone) [phone] (fax)  [facility]

## 2023-05-26 ENCOUNTER — Encounter: Payer: Self-pay | Admitting: Family Medicine

## 2023-05-26 LAB — COMPREHENSIVE METABOLIC PANEL
ALT: 13 [IU]/L (ref 0–44)
AST: 21 [IU]/L (ref 0–40)
Albumin: 4.3 g/dL (ref 3.7–4.7)
Alkaline Phosphatase: 55 [IU]/L (ref 44–121)
BUN/Creatinine Ratio: 13 (ref 10–24)
BUN: 12 mg/dL (ref 8–27)
Bilirubin Total: 0.5 mg/dL (ref 0.0–1.2)
CO2: 24 mmol/L (ref 20–29)
Calcium: 9.7 mg/dL (ref 8.6–10.2)
Chloride: 97 mmol/L (ref 96–106)
Creatinine, Ser: 0.94 mg/dL (ref 0.76–1.27)
Globulin, Total: 2.3 g/dL (ref 1.5–4.5)
Glucose: 113 mg/dL — ABNORMAL HIGH (ref 70–99)
Potassium: 3.7 mmol/L (ref 3.5–5.2)
Sodium: 138 mmol/L (ref 134–144)
Total Protein: 6.6 g/dL (ref 6.0–8.5)
eGFR: 77 mL/min/{1.73_m2} (ref >59)

## 2023-05-26 LAB — LIPID PANEL
Chol/HDL Ratio: 3.8 {ratio} (ref 0.0–5.0)
Cholesterol, Total: 169 mg/dL (ref 100–199)
HDL: 44 mg/dL (ref >39)
LDL Chol Calc (NIH): 95 mg/dL (ref 0–99)
Triglycerides: 174 mg/dL — ABNORMAL HIGH (ref 0–149)
VLDL Cholesterol Cal: 30 mg/dL (ref 5–40)

## 2023-05-26 LAB — VITAMIN B12: Vitamin B-12: 1046 pg/mL (ref 232–1245)

## 2023-05-26 LAB — CBC
Hematocrit: 40.2 % (ref 37.5–51.0)
Hemoglobin: 14.2 g/dL (ref 13.0–17.7)
MCH: 33.6 pg — ABNORMAL HIGH (ref 26.6–33.0)
MCHC: 35.3 g/dL (ref 31.5–35.7)
MCV: 95 fL (ref 79–97)
Platelets: 273 10*3/uL (ref 150–450)
RBC: 4.22 x10E6/uL (ref 4.14–5.80)
RDW: 12.6 % (ref 11.6–15.4)
WBC: 5.5 10*3/uL (ref 3.4–10.8)

## 2023-05-26 LAB — HEMOGLOBIN A1C
Est. average glucose Bld gHb Est-mCnc: 137 mg/dL
Hgb A1c MFr Bld: 6.4 % — ABNORMAL HIGH (ref 4.8–5.6)

## 2023-07-08 ENCOUNTER — Other Ambulatory Visit: Payer: Self-pay | Admitting: Family Medicine

## 2023-07-08 DIAGNOSIS — I10 Essential (primary) hypertension: Secondary | ICD-10-CM

## 2023-07-20 ENCOUNTER — Other Ambulatory Visit: Payer: Self-pay | Admitting: Family Medicine

## 2023-07-20 DIAGNOSIS — I10 Essential (primary) hypertension: Secondary | ICD-10-CM

## 2023-08-18 ENCOUNTER — Other Ambulatory Visit: Payer: Self-pay | Admitting: Family Medicine

## 2023-08-18 DIAGNOSIS — R200 Anesthesia of skin: Secondary | ICD-10-CM

## 2023-12-05 ENCOUNTER — Ambulatory Visit: Admitting: Family Medicine

## 2023-12-14 ENCOUNTER — Ambulatory Visit (INDEPENDENT_AMBULATORY_CARE_PROVIDER_SITE_OTHER): Admitting: Family Medicine

## 2023-12-14 VITALS — BP 148/61 | HR 74 | Ht 68.0 in | Wt 182.0 lb

## 2023-12-14 DIAGNOSIS — I10 Essential (primary) hypertension: Secondary | ICD-10-CM | POA: Diagnosis not present

## 2023-12-14 NOTE — Progress Notes (Signed)
 Patient presented on the nurse schedule for BP check.  He reported he was supposed to follow up with Dr Gasper but could not keep that appointment due to being out of town.  Advised patient that I would check his blood pressure today and as long as it wasn't too high we could make him a follow up appointment with Dr Gasper on a future date.   Patient agreeable  Readings were reported to Dr Gasper and he advised that he would like the p atient to follow up in 1-2 months for Glucose and blood pressure check

## 2023-12-15 ENCOUNTER — Encounter: Payer: Self-pay | Admitting: Family Medicine

## 2024-01-13 ENCOUNTER — Encounter: Payer: Self-pay | Admitting: Family Medicine

## 2024-01-13 ENCOUNTER — Ambulatory Visit (INDEPENDENT_AMBULATORY_CARE_PROVIDER_SITE_OTHER): Admitting: Family Medicine

## 2024-01-13 VITALS — BP 129/66 | HR 85 | Wt 175.0 lb

## 2024-01-13 DIAGNOSIS — R7303 Prediabetes: Secondary | ICD-10-CM

## 2024-01-13 DIAGNOSIS — R202 Paresthesia of skin: Secondary | ICD-10-CM | POA: Diagnosis not present

## 2024-01-13 DIAGNOSIS — R200 Anesthesia of skin: Secondary | ICD-10-CM

## 2024-01-13 DIAGNOSIS — I10 Essential (primary) hypertension: Secondary | ICD-10-CM | POA: Diagnosis not present

## 2024-01-13 LAB — POCT GLYCOSYLATED HEMOGLOBIN (HGB A1C)
Est. average glucose Bld gHb Est-mCnc: 137
Hemoglobin A1C: 6.4 % — AB (ref 4.0–5.6)

## 2024-01-13 NOTE — Patient Instructions (Addendum)
.   Please review the attached list of medications and notify my office if there are any errors.   . Please bring all of your medications to every appointment so we can make sure that our medication list is the same as yours.    Try OTC alpha-lipoic acid 600 three a day to see if it helps with neuropathy. If it's not helping after a month you can stop taking it.

## 2024-01-13 NOTE — Progress Notes (Signed)
 Established patient visit   Patient: Mark Hunter   DOB: August 28, 1931   89 y.o. Male  MRN: [id number] Visit Date: 01/13/2024  Today's healthcare provider: Nancyann Perry, MD   Chief Complaint  Patient presents with   Medical Management of Chronic Issues    HTN   Subjective    Discussed the use of AI scribe software for clinical note transcription with the patient, who gave verbal consent to proceed.  History of Present Illness   Mark Hunter is a 89 year old male with diabetes who presents for routine follow-up.  He has noticed a slight increase in blood sugar levels, with a recent A1c of 6.4. He maintains a balanced diet with three meals a day at home. He is currently taking B12 supplements consistently.  He has a history of neuropathy characterized by numbness rather than pain. He previously took gabapentin  for about a year and a half but discontinued it about a year ago due to lack of effect on his symptoms. There has been no change in his condition since stopping the medication. He continues to experience numbness in the lower extremities and does not drive, which he finds challenging. He uses a cane occasionally due to feeling 'a little shaky' when getting up or moving around. He uses a stimulator device for 25 minutes a day, which seems to help somewhat, although he does not use it daily.  He stays active with encouragement from his spouse, who walks daily and encourages him to join. He acknowledges not walking as much as he should but tries to stay active. He mentions plans to spend part of the year near [place], where two of his children have moved.     Lab Results  Component Value Date   HGBA1C 6.4 (A) 01/13/2024   HGBA1C 6.4 (H) 05/25/2023   HGBA1C 6.1 (H) 05/17/2022   Lab Results  Component Value Date   NA 138 05/25/2023   K 3.7 05/25/2023   CREATININE 0.94 05/25/2023   EGFR 77 05/25/2023   GLUCOSE 113 (H) 05/25/2023   Lab Results  Component Value  Date   CHOL 169 05/25/2023   HDL 44 05/25/2023   LDLCALC 95 05/25/2023   TRIG 174 (H) 05/25/2023   CHOLHDL 3.8 05/25/2023     Medications: Outpatient Medications Prior to Visit  Medication Sig Note   amLODipine  (NORVASC ) 10 MG tablet TAKE 1 TABLET BY MOUTH EVERY DAY    aspirin 81 MG tablet Take 81 mg by mouth daily.    colchicine  0.6 MG tablet TAKE 2 TABLETS ON FIRST DAY OF GOUT FLARE. THEN, TAKE 1 TABLET (0.6MG ) DAILY UNTIL RESOLVED.    hydrochlorothiazide  (HYDRODIURIL ) 25 MG tablet TAKE 1 TABLET BY MOUTH DAILY    metoprolol  succinate (TOPROL -XL) 50 MG 24 hr tablet TAKE 1 TABLET BY MOUTH EVERY DAY WITH OR IMMEDIATELY FOLLOWING A MEAL    Multiple Vitamin (MULTI-VITAMINS PO) Take 1 tablet by mouth once.    Omega-3 Fatty Acids (FISH OIL PO) Take 1 capsule by mouth once. 4000 mg    Propylene Glycol-Glycerin (SOOTHE) 0.6-0.6 % SOLN Place 3-4 drops into both eyes daily. Soothe    tamsulosin  (FLOMAX ) 0.4 MG CAPS capsule TAKE 1 CAPSULE BY MOUTH EVERY DAY    vitamin B-12 (CYANOCOBALAMIN ) 1000 MCG tablet Take 1,000 mcg by mouth daily.    naproxen (NAPROSYN) 250 MG tablet Take 250 mg by mouth.    [DISCONTINUED] gabapentin  (NEURONTIN ) 100 MG capsule Take 2 capsules (200  mg total) by mouth 2 (two) times daily. 01/13/2024: didn't help with numbness   No facility-administered medications prior to visit.   Review of Systems  Constitutional:  Negative for appetite change, chills and fever.  Respiratory:  Negative for chest tightness, shortness of breath and wheezing.   Cardiovascular:  Negative for chest pain and palpitations.  Gastrointestinal:  Negative for abdominal pain, nausea and vomiting.       Objective    BP 129/66 (BP Location: Right Arm, Patient Position: Sitting, Cuff Size: Normal)   Pulse 85   Wt 175 lb (79.4 kg)   SpO2 96%   BMI 26.61 kg/m   Physical Exam   General: Appearance:    Well developed, well nourished male in no acute distress  Eyes:    PERRL,  conjunctiva/corneas clear, EOM's intact       Lungs:     Clear to auscultation bilaterally, respirations unlabored  Heart:    Normal heart rate. Normal rhythm. No murmurs, rubs, or gallops.    MS:   All extremities are intact.    Neurologic:   Awake, alert, oriented x 3. No apparent focal neurological defect.         Results for orders placed or performed in visit on 01/13/24  POCT glycosylated hemoglobin (Hb A1C)  Result Value Ref Range   Hemoglobin A1C 6.4 (A) 4.0 - 5.6 %   Est. average glucose Bld gHb Est-mCnc 137      Assessment & Plan    1. Primary hypertension (Primary) Well controlled.  Continue current medications.    2. Pre-diabetes A1c up a bit. Counseled to be more strict with low sugar diet and increase physical activity.   3. Numbness and tingling of both lower extremities Stopped gabapentin  first prescribed by neurology. Continue b12 supplement, try alpha-lipoic acid x 1 month   Return in about 19 weeks (around 05/25/2024) for Yearly Physical, Hypertension.     Nancyann Perry, MD  [facility] [phone] (phone) [phone] (fax)  [facility]

## 2024-04-26 ENCOUNTER — Other Ambulatory Visit: Payer: Self-pay | Admitting: Family Medicine

## 2024-06-13 ENCOUNTER — Encounter: Admitting: Family Medicine
